# Patient Record
Sex: Female | Born: 1960
Health system: Southern US, Community
[De-identification: ages and names within clinical notes are randomized; demographics above are authoritative.]

## PROBLEM LIST (undated history)

## (undated) DIAGNOSIS — G894 Chronic pain syndrome: Secondary | ICD-10-CM

## (undated) DIAGNOSIS — F32A Depression, unspecified: Secondary | ICD-10-CM

## (undated) DIAGNOSIS — F329 Major depressive disorder, single episode, unspecified: Secondary | ICD-10-CM

## (undated) DIAGNOSIS — I10 Essential (primary) hypertension: Secondary | ICD-10-CM

## (undated) DIAGNOSIS — R0902 Hypoxemia: Secondary | ICD-10-CM

## (undated) DIAGNOSIS — J449 Chronic obstructive pulmonary disease, unspecified: Secondary | ICD-10-CM

## (undated) DIAGNOSIS — F419 Anxiety disorder, unspecified: Secondary | ICD-10-CM

## (undated) DIAGNOSIS — G4730 Sleep apnea, unspecified: Secondary | ICD-10-CM

## (undated) HISTORY — DX: Depression, unspecified: F32.A

## (undated) HISTORY — DX: Sleep apnea, unspecified: G47.30

## (undated) HISTORY — DX: Hypoxemia: R09.02

## (undated) HISTORY — DX: Anxiety disorder, unspecified: F41.9

## (undated) HISTORY — DX: Major depressive disorder, single episode, unspecified: F32.9

## (undated) HISTORY — PX: CHOLECYSTECTOMY: SHX55

## (undated) HISTORY — DX: Essential (primary) hypertension: I10

## (undated) HISTORY — DX: Chronic pain syndrome: G89.4

---

## 2001-04-11 ENCOUNTER — Inpatient Hospital Stay (HOSPITAL_COMMUNITY): Admission: AD | Admit: 2001-04-11 | Discharge: 2001-04-12 | Payer: Self-pay | Admitting: Family Medicine

## 2001-04-12 ENCOUNTER — Inpatient Hospital Stay (HOSPITAL_COMMUNITY): Admission: EM | Admit: 2001-04-12 | Discharge: 2001-04-17 | Payer: Self-pay | Admitting: *Deleted

## 2001-11-17 ENCOUNTER — Encounter: Payer: Self-pay | Admitting: Family Medicine

## 2001-11-17 ENCOUNTER — Ambulatory Visit (HOSPITAL_COMMUNITY): Admission: RE | Admit: 2001-11-17 | Discharge: 2001-11-17 | Payer: Self-pay | Admitting: Family Medicine

## 2002-01-01 ENCOUNTER — Ambulatory Visit (HOSPITAL_COMMUNITY): Admission: RE | Admit: 2002-01-01 | Discharge: 2002-01-01 | Payer: Self-pay | Admitting: Internal Medicine

## 2002-01-01 ENCOUNTER — Encounter: Payer: Self-pay | Admitting: Internal Medicine

## 2002-02-27 ENCOUNTER — Encounter: Payer: Self-pay | Admitting: Internal Medicine

## 2002-02-27 ENCOUNTER — Ambulatory Visit (HOSPITAL_COMMUNITY): Admission: RE | Admit: 2002-02-27 | Discharge: 2002-02-27 | Payer: Self-pay | Admitting: Internal Medicine

## 2002-04-23 ENCOUNTER — Encounter: Payer: Self-pay | Admitting: Neurosurgery

## 2002-04-23 ENCOUNTER — Encounter: Admission: RE | Admit: 2002-04-23 | Discharge: 2002-04-23 | Payer: Self-pay | Admitting: Neurosurgery

## 2002-05-07 ENCOUNTER — Encounter: Payer: Self-pay | Admitting: Neurosurgery

## 2002-05-07 ENCOUNTER — Encounter: Admission: RE | Admit: 2002-05-07 | Discharge: 2002-05-07 | Payer: Self-pay | Admitting: Neurosurgery

## 2002-05-21 ENCOUNTER — Encounter: Payer: Self-pay | Admitting: Neurosurgery

## 2002-05-21 ENCOUNTER — Encounter: Admission: RE | Admit: 2002-05-21 | Discharge: 2002-05-21 | Payer: Self-pay | Admitting: Neurosurgery

## 2003-05-13 ENCOUNTER — Ambulatory Visit (HOSPITAL_COMMUNITY): Admission: RE | Admit: 2003-05-13 | Discharge: 2003-05-13 | Payer: Self-pay | Admitting: Family Medicine

## 2003-05-20 ENCOUNTER — Ambulatory Visit (HOSPITAL_COMMUNITY): Admission: RE | Admit: 2003-05-20 | Discharge: 2003-05-20 | Payer: Self-pay | Admitting: Family Medicine

## 2003-05-30 ENCOUNTER — Ambulatory Visit (HOSPITAL_COMMUNITY): Admission: RE | Admit: 2003-05-30 | Discharge: 2003-05-30 | Payer: Self-pay | Admitting: General Surgery

## 2003-11-22 ENCOUNTER — Ambulatory Visit: Payer: Self-pay | Admitting: Psychology

## 2003-11-25 ENCOUNTER — Ambulatory Visit (HOSPITAL_COMMUNITY): Admission: RE | Admit: 2003-11-25 | Discharge: 2003-11-25 | Payer: Self-pay | Admitting: Family Medicine

## 2004-06-03 ENCOUNTER — Ambulatory Visit (HOSPITAL_COMMUNITY): Admission: RE | Admit: 2004-06-03 | Discharge: 2004-06-03 | Payer: Self-pay | Admitting: Family Medicine

## 2004-10-26 ENCOUNTER — Inpatient Hospital Stay (HOSPITAL_COMMUNITY): Admission: AD | Admit: 2004-10-26 | Discharge: 2004-10-31 | Payer: Self-pay | Admitting: Family Medicine

## 2009-09-02 ENCOUNTER — Ambulatory Visit (HOSPITAL_COMMUNITY): Admission: RE | Admit: 2009-09-02 | Discharge: 2009-09-02 | Payer: Self-pay | Admitting: Internal Medicine

## 2009-12-16 ENCOUNTER — Ambulatory Visit (HOSPITAL_COMMUNITY): Admission: RE | Admit: 2009-12-16 | Discharge: 2009-12-16 | Payer: Self-pay | Admitting: Internal Medicine

## 2010-01-14 ENCOUNTER — Ambulatory Visit (HOSPITAL_COMMUNITY): Admission: RE | Admit: 2010-01-14 | Discharge: 2010-01-14 | Payer: Self-pay | Admitting: Internal Medicine

## 2010-03-28 ENCOUNTER — Encounter: Payer: Self-pay | Admitting: Internal Medicine

## 2010-07-24 NOTE — Discharge Summary (Signed)
[facility]  Patient:    Heidi Mills, Heidi Mills Visit Number: [id number] MRN: [id number]          Service Type: PSY Location: 500 [id number] Attending Physician:  Denny Peon Dictated by:   Karleen Hampshire, M.D. Admit Date:  04/12/2001 Discharge Date: 04/17/2001                             Discharge Summary  DISCHARGE DIAGNOSES: 1. Overdose. 2. History of depression. 3. Hypothyroidism. 4. History of hypertension.  HOSPITAL COURSE:  This 50 year old white female was recently seen at [facility] Emergency Room, unconscious after being found in the bathroom approximately 5:15 a.m.  CT scan of the brain was negative.  Drug screen was positive for benzodiazepines and phencyclidine.  The patient was difficult to arouse but otherwise with normal vital signs.  She was transferred to the ICU here at this hospital, where she was observed closely with neurologic checks.  The patient, after awakening approximately 12-24 hours later, was seen by the mental health team and found to be depressed with suicidal thoughts.  She was discharged and transferred to [facility].  The patient was stable at the time of discharge.  She was alert and oriented.  During her stay she had some mildly elevated blood pressures, but these were treated adequately. Dictated by:   Karleen Hampshire, M.D. Attending Physician:  Denny Peon DD:  05/09/01 TD:  05/09/01 Job: [id number]

## 2010-07-24 NOTE — Discharge Summary (Signed)
Heidi Mills, Heidi Mills                ACCOUNT NO.:  [id number]   MEDICAL RECORD NO.:  [id number]          PATIENT TYPE:  INP   LOCATION:  A322                          FACILITY:  [facility]   PHYSICIAN:  Kirk Ruths, M.D.DATE OF BIRTH:  [birth year]   DATE OF ADMISSION:  10/26/2004  DATE OF DISCHARGE:  08/26/2006LH                                 DISCHARGE SUMMARY   FINAL DIAGNOSES:  1.  Urosepsis secondary to Klebsiella.  2.  History of hypertension.  3.  History of hypertension.  4.  Kidney stones.   HOSPITAL COURSE:  This 50 year old female was admitted to the office after  one month history of progressive urinary discomfort.  In the office, the  patient had a fever of 102 with left flank pain, some mild tenderness of the  left lower quadrant.  Urinalysis was positive for nitrates and many white  cells.  The patient was admitted to the floor, placed on IV Levaquin.  Initial white count was 17,000.  She underwent a CT of the abdomen and  pelvis to rule out acute abdominal problem.  Basically, CT's were consistent  with lymph pyelonephritis.  The patient slowly started feeling better but  continued to run fevers in the 102 range.  The patient was changed to  Rocephin and a short time later, cultures returned Klebsiella pneumonia in  her blood, which is sensitive to basically all medications except for  ampicillin.  The patient became afebrile at that time, tolerating a regular  diet.  She was comfortable at discharge and sent home on previous  medications which include Xanax, Topamax, Prempro.  She was also given a  prescription for Levaquin and will be followed in the office as needed.      Kirk Ruths, M.D.  Electronically Signed     WMM/MEDQ  D:  11/16/2004  T:  11/16/2004  Job:  [id number]

## 2010-07-24 NOTE — H&P (Signed)
Heidi Mills, Heidi Mills                          ACCOUNT NO.:  [id number]   MEDICAL RECORD NO.:  [id number]                   PATIENT TYPE:  OUT   LOCATION:  RAD                                  FACILITY:  [facility]   PHYSICIAN:  Dalia Heading, M.D.               DATE OF BIRTH:  Sep 28, 1960   DATE OF ADMISSION:  05/13/2003  DATE OF DISCHARGE:  05/13/2003                                HISTORY & PHYSICAL   CHIEF COMPLAINT:  Chronic cholecystitis.   HISTORY OF PRESENT ILLNESS:  The patient is a 50 year old white female who  was referred for evaluation and treatment of biliary colic secondary to  chronic cholecystitis.  She has been having intermittent episodes of right  upper quadrant abdominal pain with radiation to the right flank, nausea and  bloating for four weeks.  The symptoms seemed to be worsening.  No fever,  chills or jaundice have been noted.   PAST MEDICAL HISTORY:  1. Anxiety disorder.  2. Chronic back pain.   PAST SURGICAL HISTORY:  Unremarkable.   CURRENT MEDICATIONS:  1. Celexa.  2. Remeron.  3. Xanax.  4. Topamax.  5. A pill for arthritis.   ALLERGIES:  SULFA.   REVIEW OF SYSTEMS:  The patient states she has a history of hepatitis,  though it did not appear to be infectious in nature.  She does smoke 1-1/2  packs of cigarettes a day.  She rarely drinks alcohol.  She denies any other  cardiopulmonary difficulties or bleeding disorders.   PHYSICAL EXAMINATION:  GENERAL:  The patient is a well-developed, well-  nourished white female, in no acute distress.  VITAL SIGNS:  She is afebrile.  Vital signs are stable.  HEENT:  Examination reveals no scleral icterus.  LUNGS:  Clear to auscultation with equal breath sounds bilaterally.  HEART:  Examination reveals a regular rate and rhythm without S3, S4, or  murmurs.  ABDOMEN:  Soft, with slight tenderness in the right upper quadrant to  palpation.  No hepatosplenomegaly, masses or hernias are identified.   STUDIES:  1. The ultrasound of the gallbladder is normal.  2. Hepatobiliary scan revealed delayed filling of the gallbladder with     reproducible symptoms with a fatty meal.   IMPRESSION:  Chronic cholecystitis.   PLAN:  The patient was scheduled for laparoscopic cholecystectomy on May 30, 2003.  The risks and benefits of the procedure, including bleeding,  infection, hepatobiliary injury, and the possibility of an open procedure  were full explained to the patient, who gave informed consent.  Promethazine  and Darvocet have been prescribed.     ___________________________________________                                         Dalia Heading, M.D.   MAJ/MEDQ  D:  05/28/2003  T:  05/28/2003  Job:  [id number]   cc:   Kirk Ruths, M.D.  [address]  [place]  [place] [address]  Fax: [phone]

## 2010-07-24 NOTE — Op Note (Signed)
NAME:  Heidi Mills, Heidi Mills                          ACCOUNT NO.:  [id number]   MEDICAL RECORD NO.:  [id number]                   PATIENT TYPE:  AMB   LOCATION:  DAY                                  FACILITY:  [facility]   PHYSICIAN:  Dalia Heading, M.D.               DATE OF BIRTH:  01/30/1961   DATE OF PROCEDURE:  DATE OF DISCHARGE:                                 OPERATIVE REPORT   PREOPERATIVE DIAGNOSIS:  Chronic cholecystitis.   POSTOPERATIVE DIAGNOSIS:  Chronic cholecystitis.   PROCEDURE:  Laparoscopic cholecystectomy.   SURGEON:  Dalia Heading, M.D.   ASSISTANT:  Buena Irish, M.D.   ANESTHESIA:  General endotracheal.   INDICATIONS:  The patient is a 50 year old white female who presents with  biliary colic secondary to chronic cholecystitis.  The risks and benefits of  the procedure including bleeding, infection, hepatobiliary injury, and the  possibility of an open procedure were fully explained to the patient, who  gave informed consent.   PROCEDURE NOTE:  The patient was placed in the supine position.  After  induction of general endotracheal anesthesia, the abdomen was prepped and  draped using the usual sterile technique with Betadine.  Surgical site  confirmation was performed.   An infraumbilical incision was made down to the fascia.  A Veress needle was  introduced into the abdominal cavity and confirmation of placement was done  using the saline drop test.  The abdomen was then insufflated to 16 mmHg  pressure.  An 11-mm trocar was introduced into the abdominal cavity under  direct visualization without difficulty.  An additional 11-mm trocar was  placed in the epigastric region and 5-mm trocars were placed in the right  upper quadrant and right flank regions.   The liver was inspected and several adhesions were noted to the underside of  the peritoneal surface.  These were lysed without difficulty.  The  gallbladder was retracted superiorly and laterally.   The dissection was  begun around the infundibulum of the gallbladder.  The cystic duct was first  identified.  Its juncture to the infundibulum fully identified.  Endoclips  were placed proximally and distally on the cystic duct; and the cystic duct  was divided.  This was likewise done on the cystic artery.  The gallbladder  was then freed away from the gallbladder fossa using Bovie electrocautery.  The gallbladder was delivered through the epigastric trocar site using an  EndoCatch bag.  The gallbladder fossa was inspected and no abnormal bleeding  or bile leakage was noted.  Surgicel was placed in the gallbladder fossa.  All fluid and air were then evacuated from the abdominal cavity prior to  removal of the trocars.   All wounds were irrigated with normal saline.  All wounds were injected with  0.5% Sensorcaine.  The infraumbilical fascia as well as the epigastric  fascia were reapproximated using an #0 Vicryl interrupted  suture.  All skin  incisions were closed using staples.  Betadine ointment and dry sterile  dressings were applied.   All tape and needle counts correct at the end of the procedure.  The patient  was extubated in the operating room and went back to recovery room in awake  and stable condition.   COMPLICATIONS:  None.   SPECIMEN:  Gallbladder.   BLOOD LOSS:  Minimal.      ___________________________________________                                            Dalia Heading, M.D.   MAJ/MEDQ  D:  05/30/2003  T:  05/30/2003  Job:  [id number]   cc:   Dalia Heading, M.D.  [address], [place]  [place]  [place] [address]  Fax: [phone]   Kirk Ruths, M.D.  [address]  [place]  [place] [address]  Fax: [phone]
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
## 2010-07-24 NOTE — H&P (Signed)
Heidi Mills, Heidi Mills                ACCOUNT NO.:  [id number]   MEDICAL RECORD NO.:  [id number]           PATIENT TYPE:  INP   LOCATION:  A322                          FACILITY:  [facility]   PHYSICIAN:  Kirk Ruths, M.D.DATE OF BIRTH:  [birth year]   DATE OF ADMISSION:  DATE OF DISCHARGE:  LH                                HISTORY & PHYSICAL   CHIEF COMPLAINT:  Fever and low back pain.   HISTORY OF PRESENT ILLNESS:  This is a 50 year old female who began with  some urinary discomfort approximately a month ago.  She took over-the-  counter medication with some relief.  She was continuing to have some  urethral symptoms.  Then today she developed a fever, pain in her left flank  and low back, as well as left lower quadrant.  The patient's urinalysis  shows positive nitrates and many white cells in her urine, as well as blood.  She is admitted for presumptive pyelonephritis.   PAST MEDICAL HISTORY:  She has a history of:  1.  Hypertension.  2.  Goiter.  3.  Depression.  4.  Kidney stones.   ALLERGIES:  SULFA.   MEDICATIONS:  Include:  1.  Xanax 1 mg p.r.n.  2.  Topamax 75 mg q.8 a.m. and 50 mg q.h.s.  3.  She is also on Prempro.   REVIEW OF SYSTEMS:  She denies nausea and vomiting, shortness of breath, or  chest pain.   PHYSICAL EXAMINATION:  GENERAL:  A middle-aged female who appears miserable.  VITAL SIGNS:  Temperature is 102, pulse is 76 and regular, blood pressure is  120/70.  HEENT:  TMs are normal.  Pupils are equal to accommodation.  Oropharynx is  benign.  NECK:  Supple without JVD, bruits, or thyromegaly.  LUNGS:  Clear in all areas.  HEART:  Regular sinus rhythm without murmurs, rubs, or gallops.  ABDOMEN:  Soft.  Left lower quadrant tenderness with diminished bowel  sounds.  The left flank is slightly tender also.  EXTREMITIES:  Without cyanosis, clubbing, or edema.  NEUROLOGIC:  Grossly intact.   ASSESSMENT:  1.  Probable pyelonephritis.  2.  History of  hypertension.      Kirk Ruths, M.D.  Electronically Signed     WMM/MEDQ  D:  10/26/2004  T:  10/26/2004  Job:  [id number]

## 2010-07-24 NOTE — Discharge Summary (Signed)
[facility]  Patient:    BULA, CAVALIERI Visit Number: [id number] MRN: [id number]          Service Type: PSY Location: [id number] Attending Physician:  Denny Peon Dictated by:   Reymundo Poll Dub Mikes, M.D. Admit Date:  04/12/2001 Discharge Date: 04/17/2001                             Discharge Summary  CHIEF COMPLAINT AND HISTORY OF PRESENT ILLNESS:  This was the first admission to [facility] for this 50 year old female found unresponsive on the bathroom floor, last known to be alert 2:30 in the morning, found at 5:15 a.m.  EMS was called by her husband who was at home with her at the time.  Admitted to ICU until stable.  Reported she took a handful of Relafen and possibly some Seroquel but was unclear what she took, had poor memory of what happened.  Reported use of alcohol prior to this event but was vague about whether she had alcohol the day of the event or one to two days prior; poor memory for the event.  She wanted to go to sleep, not wake up.  The next thing she knew she woke up in the hospital.  Prior to this event she had four drinks, enough to get her good and drunk, she reported.  The patient denied frequent alcohol abuse.  Had a history of alcohol abuse in the distant past as well as prior benzodiazepine abuse.  Increased depression with anxiety, frequent crying spells since July 2002, worse in the last few weeks. Endorsed poor sleep, frequent awakening over the past two weeks.  Lost 30 pounds in the last three months.  Difficulty concentrating, described herself as an anxious person but denied any panic attacks, denied any suicidal ideas.  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  First admission to [facility].  PAST MEDICAL HISTORY:  Positive for hypertension, hypothyroidism, chronic back pain, hepatitis.  MEDICATIONS ON ADMISSION: 1.  Diovan and hydrochlorothiazide 80/12.5 mg. 2. Xanax 1 mg one to two as needed. 3. Naproxen for pain.  PHYSICAL EXAMINATION:  GENERAL:  Performed and failed to show any acute findings.  MENTAL STATUS EXAMINATION ON ADMISSION:  Thin, gaunt appearing female with bronze tone complexion.  Bright, friendly affect.  Slight tremor, mild agitation; otherwise polite and cooperative.  Speech was normal without pressure.  Mood was anxious.  Thought processes: Logical and goal directed; no dangerous ideas, no evidence of suicidal ideas, no homicidal ideas, no psychosis, no delusions.  Cognitive: Well preserved.  ADMITTING DIAGNOSES: Axis I:    1. Alcohol dependence.            2. Depressive disorder, not otherwise specified.            3. Benzodiazepine abuse. Axis II:   No diagnosis. Axis III:  1. Status post ______ and Seroquel overdose.            2. Hypothyroidism.            3. Hepatitis. Axis IV:   Moderate. Axis V:    Global assessment of functioning upon admission 34, highest global            assessment of functioning in the last year 64.  LABORATORY DATA:  Serum chemistry was within normal limits.  Thyroid profile was within normal limits.  Anti-HCV was positive.  Urine pregnancy was negative.  HOSPITAL COURSE:  She was admitted and started in intensive individual and group psychotherapy.  Admitted to feeling being depressed, wanting to give up, used increased amount of alcohol, as she says, to cope with the anxiety.  She did not feel that the Zoloft had worked.  She was detoxified using phenobarbital.  We started Celexa 10 mg per day and it was changed to 20 mg. She was on Topamax, Celexa was increased to 30 mg per day and she was given Seroquel and Remeron at bedtime.  By February 10, she was doing well, mood had improved, affect was bright, no suicidal ideas, no homicidal ideas.  Willing and motivated to pursue further treatment as well work on long-term sobriety.  DISCHARGE  DIAGNOSES: Axis I:    1. Alcohol dependence.            2. Depressive disorder, not otherwise specified. Axis II:   No diagnosis. Axis III:  1. Status post overdose.            2. Hypothyroidism.            3. Hepatitis. Axis IV:   Moderate. Axis V:    Global assessment of functioning upon discharge 55-60.  DISCHARGE MEDICATIONS: 1. Xanax 1 mg three times a day. 2. Topamax 25 mg two at bedtime. 3. Celexa 20 mg one and a half daily. 4. Remeron 15 mg half at bedtime.  FOLLOWUP:  Dr. Katrinka Blazing on an outpatient basis. Dictated by:   Reymundo Poll Dub Mikes, M.D. Attending Physician:  Denny Peon DD:  05/24/01 TD:  05/25/01 Job: [id number]

## 2011-07-05 ENCOUNTER — Ambulatory Visit (HOSPITAL_COMMUNITY)
Admission: RE | Admit: 2011-07-05 | Discharge: 2011-07-05 | Disposition: A | Discharge status: Home / Self Care | Payer: Self-pay | Source: Ambulatory Visit | Attending: Internal Medicine | Admitting: Internal Medicine

## 2011-07-05 ENCOUNTER — Other Ambulatory Visit (HOSPITAL_COMMUNITY): Payer: Self-pay | Admitting: Internal Medicine

## 2011-07-05 DIAGNOSIS — M549 Dorsalgia, unspecified: Secondary | ICD-10-CM

## 2011-07-05 DIAGNOSIS — M545 Low back pain, unspecified: Secondary | ICD-10-CM | POA: Insufficient documentation

## 2011-07-05 DIAGNOSIS — M533 Sacrococcygeal disorders, not elsewhere classified: Secondary | ICD-10-CM | POA: Insufficient documentation

## 2011-07-05 DIAGNOSIS — M51379 Other intervertebral disc degeneration, lumbosacral region without mention of lumbar back pain or lower extremity pain: Secondary | ICD-10-CM | POA: Insufficient documentation

## 2011-07-05 DIAGNOSIS — M5137 Other intervertebral disc degeneration, lumbosacral region: Secondary | ICD-10-CM | POA: Insufficient documentation

## 2011-07-09 ENCOUNTER — Other Ambulatory Visit (HOSPITAL_COMMUNITY): Payer: Self-pay | Admitting: Internal Medicine

## 2011-07-09 DIAGNOSIS — M549 Dorsalgia, unspecified: Secondary | ICD-10-CM

## 2011-07-13 ENCOUNTER — Ambulatory Visit (HOSPITAL_COMMUNITY)
Admission: RE | Admit: 2011-07-13 | Discharge: 2011-07-13 | Disposition: A | Discharge status: Home / Self Care | Payer: Self-pay | Source: Ambulatory Visit | Attending: Internal Medicine | Admitting: Internal Medicine

## 2011-07-13 DIAGNOSIS — M47817 Spondylosis without myelopathy or radiculopathy, lumbosacral region: Secondary | ICD-10-CM | POA: Insufficient documentation

## 2011-07-13 DIAGNOSIS — M5137 Other intervertebral disc degeneration, lumbosacral region: Secondary | ICD-10-CM | POA: Insufficient documentation

## 2011-07-13 DIAGNOSIS — M545 Low back pain, unspecified: Secondary | ICD-10-CM | POA: Insufficient documentation

## 2011-07-13 DIAGNOSIS — M549 Dorsalgia, unspecified: Secondary | ICD-10-CM

## 2011-07-13 DIAGNOSIS — M51379 Other intervertebral disc degeneration, lumbosacral region without mention of lumbar back pain or lower extremity pain: Secondary | ICD-10-CM | POA: Insufficient documentation

## 2011-07-13 DIAGNOSIS — M79609 Pain in unspecified limb: Secondary | ICD-10-CM | POA: Insufficient documentation

## 2011-12-06 ENCOUNTER — Other Ambulatory Visit (HOSPITAL_COMMUNITY): Payer: Self-pay | Admitting: Physician Assistant

## 2011-12-06 ENCOUNTER — Ambulatory Visit (HOSPITAL_COMMUNITY)
Admission: RE | Admit: 2011-12-06 | Discharge: 2011-12-06 | Disposition: A | Discharge status: Home / Self Care | Payer: Self-pay | Source: Ambulatory Visit | Attending: Physician Assistant | Admitting: Physician Assistant

## 2011-12-06 DIAGNOSIS — M25579 Pain in unspecified ankle and joints of unspecified foot: Secondary | ICD-10-CM

## 2011-12-06 DIAGNOSIS — S8263XA Displaced fracture of lateral malleolus of unspecified fibula, initial encounter for closed fracture: Secondary | ICD-10-CM | POA: Insufficient documentation

## 2011-12-06 DIAGNOSIS — S93409A Sprain of unspecified ligament of unspecified ankle, initial encounter: Secondary | ICD-10-CM

## 2011-12-06 DIAGNOSIS — X58XXXA Exposure to other specified factors, initial encounter: Secondary | ICD-10-CM | POA: Insufficient documentation

## 2011-12-07 ENCOUNTER — Emergency Department (HOSPITAL_COMMUNITY)
Admission: EM | Admit: 2011-12-07 | Discharge: 2011-12-08 | Disposition: A | Discharge status: Home / Self Care | Payer: Self-pay | Attending: Emergency Medicine | Admitting: Emergency Medicine

## 2011-12-07 ENCOUNTER — Encounter (HOSPITAL_COMMUNITY): Payer: Self-pay | Admitting: Emergency Medicine

## 2011-12-07 DIAGNOSIS — I959 Hypotension, unspecified: Secondary | ICD-10-CM | POA: Insufficient documentation

## 2011-12-07 DIAGNOSIS — G8929 Other chronic pain: Secondary | ICD-10-CM | POA: Insufficient documentation

## 2011-12-07 DIAGNOSIS — F17200 Nicotine dependence, unspecified, uncomplicated: Secondary | ICD-10-CM | POA: Insufficient documentation

## 2011-12-07 DIAGNOSIS — J449 Chronic obstructive pulmonary disease, unspecified: Secondary | ICD-10-CM | POA: Insufficient documentation

## 2011-12-07 DIAGNOSIS — T400X1A Poisoning by opium, accidental (unintentional), initial encounter: Secondary | ICD-10-CM | POA: Insufficient documentation

## 2011-12-07 DIAGNOSIS — M549 Dorsalgia, unspecified: Secondary | ICD-10-CM | POA: Insufficient documentation

## 2011-12-07 DIAGNOSIS — Z888 Allergy status to other drugs, medicaments and biological substances status: Secondary | ICD-10-CM | POA: Insufficient documentation

## 2011-12-07 DIAGNOSIS — J4489 Other specified chronic obstructive pulmonary disease: Secondary | ICD-10-CM | POA: Insufficient documentation

## 2011-12-07 DIAGNOSIS — T40601A Poisoning by unspecified narcotics, accidental (unintentional), initial encounter: Secondary | ICD-10-CM | POA: Insufficient documentation

## 2011-12-07 HISTORY — DX: Chronic obstructive pulmonary disease, unspecified: J44.9

## 2011-12-07 NOTE — ED Notes (Signed)
Pt reports to have accidentally taken too many oxycodone pills, states to have taken 2-3 15mg  tablets. Denies SI just states it was an accident. Also states she took an [place] and also takes lexapro. Pt was found unresponsive by EMS. Pt given 0.5mg  narcan in route and woke up, pt aax4 at this time, ems reports pt is hypotensive.

## 2011-12-08 LAB — BASIC METABOLIC PANEL
BUN: 10 mg/dL (ref 6–23)
Chloride: 102 mEq/L (ref 96–112)
Creatinine, Ser: 0.9 mg/dL (ref 0.50–1.10)
GFR calc Af Amer: 84 mL/min — ABNORMAL LOW (ref >90)

## 2011-12-08 LAB — CBC
HCT: 44.1 % (ref 36.0–46.0)
MCHC: 34 g/dL (ref 30.0–36.0)
MCV: 95.7 fL (ref 78.0–100.0)
RDW: 14.2 % (ref 11.5–15.5)
WBC: 8.7 10*3/uL (ref 4.0–10.5)

## 2011-12-08 MED ORDER — SODIUM CHLORIDE 0.9 % IV SOLN
1000.0000 mL | Freq: Once | INTRAVENOUS | Status: AC
  Administered 2011-12-08: 1000 mL via INTRAVENOUS

## 2011-12-08 MED ORDER — SODIUM CHLORIDE 0.9 % IV SOLN
1000.0000 mL | INTRAVENOUS | Status: DC
  Administered 2011-12-08: 1000 mL via INTRAVENOUS

## 2011-12-08 NOTE — ED Provider Notes (Signed)
History     CSN: [id number]  Arrival date & time 12/07/11  2340   First MD Initiated Contact with Patient 12/08/11 0002      Chief Complaint  Patient presents with  . Drug Overdose  . Hypotension     The history is provided by the patient, the spouse and a relative.   patient has long-standing history of chronic back pain.  Should her recent left ankle fracture for which she required some additional pain medicine.  She is on oxycodone 15 mg immediate release tablets at home.  She was found minimally responsive by her family in bed tonight was difficult to arouse.  EMS arrived the patient pinpoint pupils.  She was given Narcan with complete resolution of her symptoms.    She denies chest pain shortness breath.  She has no abdominal pain.  She has no other complaints.  She is unsure how much of her oxycodone she takes  She's now at the bedside without any complaints.  Family reports is back to baseline.  Her presenting blood pressure however was 83/54.    Past Medical History  Diagnosis Date  . COPD (chronic obstructive pulmonary disease)   . Back pain     History reviewed. No pertinent past surgical history.  History reviewed. No pertinent family history.  History  Substance Use Topics  . Smoking status: Current Every Day Smoker -- 1.0 packs/day    Types: Cigarettes  . Smokeless tobacco: Not on file  . Alcohol Use: No    OB History    Grav Para Term Preterm Abortions TAB SAB Ect Mult Living                  Review of Systems  All other systems reviewed and are negative.    Allergies  Sulfa antibiotics  Home Medications   Current Outpatient Rx  Name Route Sig Dispense Refill  . ESCITALOPRAM OXALATE 20 MG PO TABS Oral Take 20 mg by mouth daily.    . OXYCODONE HCL 15 MG PO TABS Oral Take 15 mg by mouth every 4 (four) hours as needed.    Marland Kitchen ZOLPIDEM TARTRATE 5 MG PO TABS Oral Take 5 mg by mouth at bedtime as needed.      BP 100/63  Pulse 72  Temp 97.5 F (36.4  C) (Oral)  Resp 15  Ht 5\' 2"  (1.575 m)  Wt 115 lb (52.164 kg)  BMI 21.03 kg/m2  SpO2 99%  Physical Exam  Nursing note and vitals reviewed. Constitutional: She is oriented to person, place, and time. She appears well-developed and well-nourished. No distress.  HENT:  Head: Normocephalic and atraumatic.  Eyes: EOM are normal.  Neck: Normal range of motion.  Cardiovascular: Normal rate, regular rhythm and normal heart sounds.   Pulmonary/Chest: Effort normal and breath sounds normal.  Abdominal: Soft. She exhibits no distension. There is no tenderness.  Musculoskeletal: She exhibits no edema.  Neurological: She is alert and oriented to person, place, and time.  Skin: Skin is warm and dry.  Psychiatric: She has a normal mood and affect. Judgment normal.    ED Course  Procedures (including critical care time)  Labs Reviewed  CBC - Abnormal; Notable for the following:    Platelets 146 (*)     All other components within normal limits  BASIC METABOLIC PANEL - Abnormal; Notable for the following:    Calcium 8.3 (*)     GFR calc non Af Amer 72 (*)  GFR calc Af Amer 84 (*)     All other components within normal limits      1. Narcotic overdose       MDM  Is likely unintentional overdose of her oxycodone.  She'll be monitored in the emergency department.  Her hypertension is likely secondary to her oxycodone use.  She we hydrated with IV fluids and I will continue to monitor this closely.    1:15 AM 100/63 after IVFs. Drinking coffee and sitting up at the bedside.   1:55 AM Nearly 2.5 hours without any symptoms. Dc home as unintentional overdose.   Lyanne Co, MD 12/08/11 [phone]

## 2015-01-24 ENCOUNTER — Ambulatory Visit (HOSPITAL_COMMUNITY)
Admission: RE | Admit: 2015-01-24 | Discharge: 2015-01-24 | Disposition: A | Discharge status: Home / Self Care | Payer: Self-pay | Source: Ambulatory Visit | Attending: Internal Medicine | Admitting: Internal Medicine

## 2015-01-24 ENCOUNTER — Other Ambulatory Visit (HOSPITAL_COMMUNITY): Payer: Self-pay | Admitting: Internal Medicine

## 2015-01-24 DIAGNOSIS — R079 Chest pain, unspecified: Secondary | ICD-10-CM | POA: Insufficient documentation

## 2015-01-24 DIAGNOSIS — R918 Other nonspecific abnormal finding of lung field: Secondary | ICD-10-CM | POA: Insufficient documentation

## 2015-01-24 DIAGNOSIS — F17200 Nicotine dependence, unspecified, uncomplicated: Secondary | ICD-10-CM | POA: Insufficient documentation

## 2015-04-07 DIAGNOSIS — Z720 Tobacco use: Secondary | ICD-10-CM | POA: Insufficient documentation

## 2015-04-07 DIAGNOSIS — I209 Angina pectoris, unspecified: Secondary | ICD-10-CM | POA: Insufficient documentation

## 2015-04-07 DIAGNOSIS — R55 Syncope and collapse: Secondary | ICD-10-CM | POA: Insufficient documentation

## 2015-04-07 DIAGNOSIS — J449 Chronic obstructive pulmonary disease, unspecified: Secondary | ICD-10-CM | POA: Insufficient documentation

## 2015-10-20 DIAGNOSIS — Z1389 Encounter for screening for other disorder: Secondary | ICD-10-CM | POA: Diagnosis not present

## 2015-10-20 DIAGNOSIS — R0789 Other chest pain: Secondary | ICD-10-CM | POA: Diagnosis not present

## 2015-10-20 DIAGNOSIS — Z681 Body mass index (BMI) 19 or less, adult: Secondary | ICD-10-CM | POA: Diagnosis not present

## 2015-10-20 DIAGNOSIS — Z0000 Encounter for general adult medical examination without abnormal findings: Secondary | ICD-10-CM | POA: Diagnosis not present

## 2015-10-20 DIAGNOSIS — J449 Chronic obstructive pulmonary disease, unspecified: Secondary | ICD-10-CM | POA: Diagnosis not present

## 2015-11-18 ENCOUNTER — Encounter: Payer: Self-pay | Admitting: *Deleted

## 2015-11-22 DIAGNOSIS — G4730 Sleep apnea, unspecified: Secondary | ICD-10-CM | POA: Diagnosis not present

## 2015-11-23 DIAGNOSIS — G4730 Sleep apnea, unspecified: Secondary | ICD-10-CM | POA: Diagnosis not present

## 2015-11-24 ENCOUNTER — Other Ambulatory Visit: Payer: Self-pay | Admitting: Women's Health

## 2015-12-22 ENCOUNTER — Encounter (HOSPITAL_COMMUNITY): Payer: Medicare Other

## 2015-12-22 ENCOUNTER — Ambulatory Visit (INDEPENDENT_AMBULATORY_CARE_PROVIDER_SITE_OTHER): Payer: Medicare Other | Admitting: Cardiology

## 2015-12-22 ENCOUNTER — Encounter: Payer: Self-pay | Admitting: Cardiology

## 2015-12-22 VITALS — BP 132/74 | HR 83 | Ht 62.0 in | Wt 109.0 lb

## 2015-12-22 DIAGNOSIS — R002 Palpitations: Secondary | ICD-10-CM | POA: Diagnosis not present

## 2015-12-22 DIAGNOSIS — I10 Essential (primary) hypertension: Secondary | ICD-10-CM | POA: Diagnosis not present

## 2015-12-22 DIAGNOSIS — Z720 Tobacco use: Secondary | ICD-10-CM

## 2015-12-22 DIAGNOSIS — Z87898 Personal history of other specified conditions: Secondary | ICD-10-CM | POA: Diagnosis not present

## 2015-12-22 DIAGNOSIS — E782 Mixed hyperlipidemia: Secondary | ICD-10-CM

## 2015-12-22 NOTE — Progress Notes (Signed)
.    Cardiology Office Note  Date: 12/22/2015   ID: Heidi Mills. Heidi Mills, DOB December 23, 1960, MRN [id number]  PCP: Glo Herring., MD  Consulting Cardiologist: Rozann Lesches, MD   Chief Complaint  Patient presents with  . History of chest pain    History of Present Illness: Heidi Mills. Eisenhardt is a 55 y.o. female referred for cardiology consultation by Dr. Gerarda Fraction. She is a former patient of Dr. Hamilton Capri with the [facility] cardiology practice. I reviewed the available records. She has a history of chest pain and was seen back in January of this year for further evaluation, workup had been limited by lack of insurance including use of regular medications. Records indicate that she had a negative exercise echocardiogram, although I am not able to pull up the actual report. She states that she has felt better since being compliant with her medications, health insurance now in place. She is currently on aspirin, Lipitor, Isordil, and lisinopril.  She does report occasional palpitations, describes a feeling of rapid heartbeat. She has had no dizziness or syncope associated with this.  Today we discussed risk factor modification, smoking cessation. There is a history of premature CAD in her mother. She has been back on Lipitor recently.  I reviewed her ECG today which shows sinus rhythm with poor R wave progression anteriorly, rule out old anterior infarct pattern.  She states that she has had a recent sleep study and was diagnosed with sleep apnea by Dr. Gerarda Fraction. No treatment yet in place.  Past Medical History:  Diagnosis Date  . Anxiety   . Chronic pain syndrome   . COPD (chronic obstructive pulmonary disease) ([place])   . Depression   . Essential hypertension     Past Surgical History:  Procedure Laterality Date  . CHOLECYSTECTOMY      Current Outpatient Prescriptions  Medication Sig Dispense Refill  . albuterol (PROAIR HFA) 108 (90 Base) MCG/ACT inhaler Inhale 2 puffs into the lungs every 4  (four) hours as needed for wheezing or shortness of breath.    . ALPRAZolam (XANAX) 1 MG tablet Take 1 mg by mouth 4 (four) times daily.    Marland Kitchen aspirin EC 81 MG tablet Take 81 mg by mouth daily.    Marland Kitchen atorvastatin (LIPITOR) 10 MG tablet Take 10 mg by mouth daily.    Marland Kitchen escitalopram (LEXAPRO) 20 MG tablet Take 20 mg by mouth daily.    . isosorbide dinitrate (ISORDIL) 10 MG tablet Take 10 mg by mouth 3 (three) times daily.  11  . lisinopril (PRINIVIL,ZESTRIL) 2.5 MG tablet Take 2.5 mg by mouth daily.    . OXYCODONE HCL PO Take 30 mg by mouth every 4 (four) hours as needed.    . Tiotropium Bromide-Olodaterol (STIOLTO RESPIMAT) 2.5-2.5 MCG/ACT AERS Inhale 2 Inhalers into the lungs daily.     No current facility-administered medications for this visit.    Allergies:  Penicillins and Sulfa antibiotics   Social History: The patient  reports that she has been smoking Cigarettes.  She started smoking about 42 years ago. She has been smoking about 1.00 pack per day. She has never used smokeless tobacco. She reports that she does not drink alcohol.   Family History: The patient's family history includes CAD in her mother; Heart attack in her mother.   ROS:  Please see the history of present illness. Otherwise, complete review of systems is positive for daytime sleepiness and fatigue.  All other systems are reviewed and negative.   Physical Exam:  VS:  BP 132/74   Pulse 83   Ht 5\' 2"  (1.575 m)   Wt 109 lb (49.4 kg)   SpO2 93%   BMI 19.94 kg/m , BMI Body mass index is 19.94 kg/m.  Wt Readings from Last 3 Encounters:  12/22/15 109 lb (49.4 kg)  12/07/11 115 lb (52.2 kg)    General: Thin, chronically ill-appearing woman in no distress. HEENT: Conjunctiva and lids normal, oropharynx clear. Neck: Supple, no elevated JVP or carotid bruits, no thyromegaly. Lungs: Decreased breath sounds without wheezing, nonlabored breathing at rest. Cardiac: Regular rate and rhythm, no S3 or significant systolic  murmur, no pericardial rub. Abdomen: Soft, nontender, bowel sounds present, no guarding or rebound. Extremities: No pitting edema, distal pulses 2+. Skin: Warm and dry. Musculoskeletal: No kyphosis. Neuropsychiatric: Alert and oriented x3, affect grossly appropriate.  ECG: There is no old tracing for comparison.  Other Studies Reviewed Today:  Chest x-ray 01/24/2015: IMPRESSION: No acute cardiopulmonary disease.  Innumerable small calcified pulmonary nodules, likely related to old granulomatous disease. Remote varicella infection could have a similar appearance.  Assessment and Plan:  1. History of chest pain with cardiac risk factors including family history of premature CAD in her mother, active tobacco abuse, hyperlipidemia, and hypertension. Patient underwent previous cardiac testing with Dr. Hamilton Capri and had reportedly a negative exercise echocardiogram earlier this year. She is symptomatically stable now having been compliant with medical therapy, and we will plan to continue with observation for now. Requesting the report of her prior stress test.  2. Long-standing tobacco abuse. We did discuss smoking cessation today.  3. Hyperlipidemia, reports being back on Lipitor recently. Continues to follow with Dr. Gerarda Fraction.  4. Essential hypertension, no changes made to present regimen. She is on lisinopril.  5. Recently diagnosed obstructive sleep apnea by report.  6. Intermittent palpitations. We will obtain a 48-hour Holter monitor to investigate potential paroxysmal arrhythmias.  Current medicines were reviewed with the patient today.   Orders Placed This Encounter  Procedures  . Holter monitor - 48 hour  . EKG 12-Lead    Disposition: Follow-up with me in 6 months, sooner if needed.  Signed, Satira Sark, MD, [facility] 12/22/2015 2:54 PM    [facility] at [facility] [address] [place], [place] [address] Phone: [phone]; Fax: [phone]

## 2015-12-22 NOTE — Patient Instructions (Signed)
Your physician wants you to follow-up in: 6 months Dr Ferne Reus will receive a reminder letter in the mail two months in advance. If you don't receive a letter, please call our office to schedule the follow-up appointment. .  Your physician has recommended that you wear a holter monitor 48 hr. Holter monitors are medical devices that record the heart's electrical activity. Doctors most often use these monitors to diagnose arrhythmias. Arrhythmias are problems with the speed or rhythm of the heartbeat. The monitor is a small, portable device. You can wear one while you do your normal daily activities. This is usually used to diagnose what is causing palpitations/syncope (passing out).      Your physician recommends that you continue on your current medications as directed. Please refer to the Current Medication list given to you today.     Thank you for choosing [facility] !

## 2015-12-23 ENCOUNTER — Telehealth: Payer: Self-pay

## 2015-12-23 ENCOUNTER — Ambulatory Visit (HOSPITAL_COMMUNITY)
Admission: RE | Admit: 2015-12-23 | Discharge: 2015-12-23 | Disposition: A | Discharge status: Home / Self Care | Payer: Medicare Other | Source: Ambulatory Visit | Attending: Cardiology | Admitting: Cardiology

## 2015-12-23 DIAGNOSIS — R002 Palpitations: Secondary | ICD-10-CM | POA: Diagnosis not present

## 2015-12-23 NOTE — Telephone Encounter (Signed)
LM with dr Dion Body office in [place] [phone] asking for results of stress echo done this past jan/Feb,await return call or faxed results

## 2016-01-21 DIAGNOSIS — Z6821 Body mass index (BMI) 21.0-21.9, adult: Secondary | ICD-10-CM | POA: Diagnosis not present

## 2016-01-21 DIAGNOSIS — Z1389 Encounter for screening for other disorder: Secondary | ICD-10-CM | POA: Diagnosis not present

## 2016-01-21 DIAGNOSIS — R252 Cramp and spasm: Secondary | ICD-10-CM | POA: Diagnosis not present

## 2016-01-21 DIAGNOSIS — J449 Chronic obstructive pulmonary disease, unspecified: Secondary | ICD-10-CM | POA: Diagnosis not present

## 2016-04-23 DIAGNOSIS — J449 Chronic obstructive pulmonary disease, unspecified: Secondary | ICD-10-CM | POA: Diagnosis not present

## 2016-04-23 DIAGNOSIS — Z1389 Encounter for screening for other disorder: Secondary | ICD-10-CM | POA: Diagnosis not present

## 2016-04-23 DIAGNOSIS — G894 Chronic pain syndrome: Secondary | ICD-10-CM | POA: Diagnosis not present

## 2016-04-23 DIAGNOSIS — Z6820 Body mass index (BMI) 20.0-20.9, adult: Secondary | ICD-10-CM | POA: Diagnosis not present

## 2016-07-08 DIAGNOSIS — J449 Chronic obstructive pulmonary disease, unspecified: Secondary | ICD-10-CM | POA: Diagnosis not present

## 2016-07-08 DIAGNOSIS — Z882 Allergy status to sulfonamides status: Secondary | ICD-10-CM | POA: Diagnosis not present

## 2016-07-08 DIAGNOSIS — H1131 Conjunctival hemorrhage, right eye: Secondary | ICD-10-CM | POA: Diagnosis not present

## 2016-07-08 DIAGNOSIS — Z880 Allergy status to penicillin: Secondary | ICD-10-CM | POA: Diagnosis not present

## 2016-07-08 DIAGNOSIS — I10 Essential (primary) hypertension: Secondary | ICD-10-CM | POA: Diagnosis not present

## 2016-07-23 DIAGNOSIS — I10 Essential (primary) hypertension: Secondary | ICD-10-CM | POA: Diagnosis not present

## 2016-07-23 DIAGNOSIS — G894 Chronic pain syndrome: Secondary | ICD-10-CM | POA: Diagnosis not present

## 2016-07-30 ENCOUNTER — Encounter: Payer: Self-pay | Admitting: *Deleted

## 2016-10-15 DIAGNOSIS — Z6820 Body mass index (BMI) 20.0-20.9, adult: Secondary | ICD-10-CM | POA: Diagnosis not present

## 2016-10-15 DIAGNOSIS — J029 Acute pharyngitis, unspecified: Secondary | ICD-10-CM | POA: Diagnosis not present

## 2016-10-15 DIAGNOSIS — K1120 Sialoadenitis, unspecified: Secondary | ICD-10-CM | POA: Diagnosis not present

## 2016-10-15 DIAGNOSIS — M47816 Spondylosis without myelopathy or radiculopathy, lumbar region: Secondary | ICD-10-CM | POA: Diagnosis not present

## 2016-10-20 DIAGNOSIS — Z1211 Encounter for screening for malignant neoplasm of colon: Secondary | ICD-10-CM | POA: Diagnosis not present

## 2016-12-08 DIAGNOSIS — Z7982 Long term (current) use of aspirin: Secondary | ICD-10-CM | POA: Diagnosis not present

## 2016-12-08 DIAGNOSIS — T50904A Poisoning by unspecified drugs, medicaments and biological substances, undetermined, initial encounter: Secondary | ICD-10-CM | POA: Diagnosis not present

## 2016-12-08 DIAGNOSIS — J439 Emphysema, unspecified: Secondary | ICD-10-CM | POA: Diagnosis not present

## 2016-12-08 DIAGNOSIS — Z720 Tobacco use: Secondary | ICD-10-CM | POA: Diagnosis not present

## 2016-12-08 DIAGNOSIS — R061 Stridor: Secondary | ICD-10-CM | POA: Diagnosis not present

## 2016-12-08 DIAGNOSIS — T402X1A Poisoning by other opioids, accidental (unintentional), initial encounter: Secondary | ICD-10-CM | POA: Diagnosis not present

## 2016-12-08 DIAGNOSIS — Z79899 Other long term (current) drug therapy: Secondary | ICD-10-CM | POA: Diagnosis not present

## 2016-12-08 DIAGNOSIS — T50901A Poisoning by unspecified drugs, medicaments and biological substances, accidental (unintentional), initial encounter: Secondary | ICD-10-CM | POA: Diagnosis not present

## 2016-12-08 DIAGNOSIS — J449 Chronic obstructive pulmonary disease, unspecified: Secondary | ICD-10-CM | POA: Diagnosis not present

## 2016-12-13 DIAGNOSIS — J449 Chronic obstructive pulmonary disease, unspecified: Secondary | ICD-10-CM | POA: Diagnosis not present

## 2016-12-13 DIAGNOSIS — Z23 Encounter for immunization: Secondary | ICD-10-CM | POA: Diagnosis not present

## 2016-12-13 DIAGNOSIS — Z0001 Encounter for general adult medical examination with abnormal findings: Secondary | ICD-10-CM | POA: Diagnosis not present

## 2016-12-13 DIAGNOSIS — Z1389 Encounter for screening for other disorder: Secondary | ICD-10-CM | POA: Diagnosis not present

## 2016-12-13 DIAGNOSIS — G894 Chronic pain syndrome: Secondary | ICD-10-CM | POA: Diagnosis not present

## 2016-12-22 DIAGNOSIS — G4733 Obstructive sleep apnea (adult) (pediatric): Secondary | ICD-10-CM | POA: Diagnosis not present

## 2017-01-22 DIAGNOSIS — G4733 Obstructive sleep apnea (adult) (pediatric): Secondary | ICD-10-CM | POA: Diagnosis not present

## 2017-02-09 ENCOUNTER — Encounter: Payer: Self-pay | Admitting: Cardiology

## 2017-02-09 ENCOUNTER — Ambulatory Visit: Payer: Medicare Other | Admitting: Cardiology

## 2017-02-09 VITALS — BP 96/69 | HR 103 | Ht 62.0 in | Wt 111.0 lb

## 2017-02-09 DIAGNOSIS — E782 Mixed hyperlipidemia: Secondary | ICD-10-CM

## 2017-02-09 DIAGNOSIS — Z87898 Personal history of other specified conditions: Secondary | ICD-10-CM | POA: Diagnosis not present

## 2017-02-09 DIAGNOSIS — Z720 Tobacco use: Secondary | ICD-10-CM

## 2017-02-09 MED ORDER — AMLODIPINE BESYLATE 10 MG PO TABS: 10.0000 mg | ORAL_TABLET | Freq: Every day | ORAL | 3 refills | Status: DC

## 2017-02-09 MED ORDER — ISOSORBIDE DINITRATE 10 MG PO TABS: 10.0000 mg | ORAL_TABLET | Freq: Three times a day (TID) | ORAL | 3 refills | Status: DC

## 2017-02-09 NOTE — Patient Instructions (Signed)

## 2017-02-09 NOTE — Progress Notes (Signed)
Cardiology Office Note  Date: 02/09/2017   ID: Heidi Mills. Parlett, DOB 11-Feb-1961, MRN [id number]  PCP: Patient, No Pcp Per  Primary Cardiologist: Rozann Lesches, MD   Chief Complaint  Patient presents with  . Cardiac follow-up    History of Present Illness: [name]. Heidi Mills is a 56 y.o. female seen in consultation back in October 2017.  She presents to the office for follow-up.  In the interim she has lost her primary care provider.  From a cardiac perspective she denies any clear-cut angina symptoms.  I did receive her previous exercise echocardiogram report from [facility] which was reassuring as outlined below.  I personally reviewed her ECG today which shows normal sinus rhythm.  I reviewed her current medications.  She states that she needed refills for Isordil and Norvasc which we will provide.  Past Medical History:  Diagnosis Date  . Anxiety   . Chronic pain syndrome   . COPD (chronic obstructive pulmonary disease) ([place])   . Depression   . Essential hypertension     Past Surgical History:  Procedure Laterality Date  . CHOLECYSTECTOMY      Current Outpatient Medications  Medication Sig Dispense Refill  . albuterol (PROAIR HFA) 108 (90 Base) MCG/ACT inhaler Inhale 2 puffs into the lungs every 4 (four) hours as needed for wheezing or shortness of breath.    . ALPRAZolam (XANAX) 1 MG tablet Take 1 mg by mouth 4 (four) times daily.    Marland Kitchen amLODipine (NORVASC) 10 MG tablet Take 1 tablet (10 mg total) by mouth daily. 90 tablet 3  . aspirin EC 81 MG tablet Take 81 mg by mouth daily.    Marland Kitchen atorvastatin (LIPITOR) 10 MG tablet Take 10 mg by mouth daily.    Marland Kitchen escitalopram (LEXAPRO) 20 MG tablet Take 20 mg by mouth daily.    . isosorbide dinitrate (ISORDIL) 10 MG tablet Take 1 tablet (10 mg total) by mouth 3 (three) times daily. 90 tablet 3  . OXYCODONE HCL PO Take 30 mg by mouth every 4 (four) hours as needed.    . Tiotropium Bromide-Olodaterol (STIOLTO RESPIMAT) 2.5-2.5 MCG/ACT  AERS Inhale 2 Inhalers into the lungs daily.     No current facility-administered medications for this visit.    Allergies:  Penicillins and Sulfa antibiotics   Social History: The patient  reports that she has been smoking cigarettes.  She started smoking about 43 years ago. She has been smoking about 1.00 pack per day. she has never used smokeless tobacco. She reports that she does not drink alcohol.   ROS:  Please see the history of present illness. Otherwise, complete review of systems is positive for chronic back pain and leg pain.  All other systems are reviewed and negative.   Physical Exam: VS:  BP 96/69   Pulse (!) 103   Ht 5\' 2"  (1.575 m)   Wt 111 lb (50.3 kg)   SpO2 93% Comment: on room air  BMI 20.30 kg/m , BMI Body mass index is 20.3 kg/m.  Wt Readings from Last 3 Encounters:  02/09/17 111 lb (50.3 kg)  12/22/15 109 lb (49.4 kg)  12/07/11 115 lb (52.2 kg)    General: Thin woman, no distress. HEENT: Conjunctiva and lids normal, oropharynx clear. Neck: Supple, no elevated JVP or carotid bruits, no thyromegaly. Lungs: Diminished breath sounds without wheezing, nonlabored breathing at rest. Cardiac: Regular rate and rhythm, no S3 or significant systolic murmur, no pericardial rub. Abdomen: Soft, nontender, bowel sounds present,  no guarding or rebound. Extremities: No pitting edema, distal pulses 2+. Skin: Warm and dry. Musculoskeletal: No kyphosis. Neuropsychiatric: Alert and oriented x3, affect grossly appropriate.  ECG: I personally reviewed the tracing from 12/22/2015 which showed sinus rhythm with poor anterior R wave progression.  Other Studies Reviewed Today:  Exercise echocardiogram 04/17/2015 [facility]): No diagnostic ST segment changes at 82% MPHR.  No inducible wall motion abnormalities to indicate ischemia.  Assessment and Plan:  1.  History of chest pain with overall reassuring cardiac workup so far.  Exercise echocardiogram from last year was low risk.   ECG is normal today.  We will continue with risk factor modification and medical therapy.  Refills provided for Norvasc and Isordil.  2.  Health maintenance.  I recommended that she establish with a new primary care provider.  3.  Hyperlipidemia, states that she is taking Lipitor.  4.  Ongoing tobacco abuse.  We have discussed smoking cessation.  Current medicines were reviewed with the patient today.   Orders Placed This Encounter  Procedures  . EKG 12-Lead    Disposition: Follow-up in 1 year.  Signed, Satira Sark, MD, [facility] 02/09/2017 2:36 PM    [facility] at [facility] [address] [place], [place] [address] Phone: [phone]; Fax: [phone]

## 2017-02-21 DIAGNOSIS — G4733 Obstructive sleep apnea (adult) (pediatric): Secondary | ICD-10-CM | POA: Diagnosis not present
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
## 2017-03-23 ENCOUNTER — Ambulatory Visit (INDEPENDENT_AMBULATORY_CARE_PROVIDER_SITE_OTHER): Payer: Medicare Other | Admitting: Psychiatry

## 2017-03-23 ENCOUNTER — Encounter (HOSPITAL_COMMUNITY): Payer: Self-pay | Admitting: Psychiatry

## 2017-03-23 ENCOUNTER — Encounter: Payer: Self-pay | Admitting: Psychiatry

## 2017-03-23 ENCOUNTER — Encounter (INDEPENDENT_AMBULATORY_CARE_PROVIDER_SITE_OTHER): Payer: Self-pay

## 2017-03-23 VITALS — BP 130/76 | HR 93 | Ht 62.0 in | Wt 111.0 lb

## 2017-03-23 DIAGNOSIS — R45851 Suicidal ideations: Secondary | ICD-10-CM

## 2017-03-23 DIAGNOSIS — Z811 Family history of alcohol abuse and dependence: Secondary | ICD-10-CM

## 2017-03-23 DIAGNOSIS — G4700 Insomnia, unspecified: Secondary | ICD-10-CM | POA: Diagnosis not present

## 2017-03-23 DIAGNOSIS — F1021 Alcohol dependence, in remission: Secondary | ICD-10-CM | POA: Diagnosis not present

## 2017-03-23 DIAGNOSIS — Z818 Family history of other mental and behavioral disorders: Secondary | ICD-10-CM

## 2017-03-23 DIAGNOSIS — R450 Nervousness: Secondary | ICD-10-CM | POA: Diagnosis not present

## 2017-03-23 DIAGNOSIS — F331 Major depressive disorder, recurrent, moderate: Secondary | ICD-10-CM | POA: Diagnosis not present

## 2017-03-23 DIAGNOSIS — F419 Anxiety disorder, unspecified: Secondary | ICD-10-CM | POA: Diagnosis not present

## 2017-03-23 MED ORDER — ESCITALOPRAM OXALATE 20 MG PO TABS
20.0000 mg | ORAL_TABLET | Freq: Every day | ORAL | 0 refills | Status: DC
Start: 2017-03-23 — End: 2017-04-25

## 2017-03-23 MED ORDER — LORAZEPAM 1 MG PO TABS: 1.0000 mg | ORAL_TABLET | Freq: Three times a day (TID) | ORAL | 0 refills | Status: DC | PRN

## 2017-03-23 NOTE — Patient Instructions (Addendum)
1. Discontinue Xanax 2. Start ativan 1 mg three times a day as needed for anxiety  3. Continue lexapro 20 mg daily  4. Obtain urine drug screen 5. Obtain record from Dr. Gerarda Fraction 6. Referral to therapy 7. Return to clinic in one month for 30 mins  CONTACT INFORMATION  What to do if you need to get in touch with someone regarding a psychiatric issue:  1. EMERGENCY: For psychiatric emergencies (if you are suicidal or if there are any other safety issues) call 911 and/or go to your nearest Emergency Room immediately.   2. IF YOU NEED SOMEONE TO TALK TO RIGHT NOW: Given my clinical responsibilities, I may not be able to speak with you over the phone for a prolonged period of time.  a. You may always call The National Suicide Prevention Lifeline at 1-800-273-TALK (856)019-5130).  b. Your county of residence will also have local crisis services. For [facility]: [place] at [phone]

## 2017-03-24 DIAGNOSIS — G4733 Obstructive sleep apnea (adult) (pediatric): Secondary | ICD-10-CM | POA: Diagnosis not present

## 2017-03-30 ENCOUNTER — Telehealth (HOSPITAL_COMMUNITY): Payer: Self-pay | Admitting: Psychiatry

## 2017-03-30 NOTE — Telephone Encounter (Signed)
Reviewed UDS on 03/23/2017  Positive forlexapro, oxycodone, Xanax Otherwise, none detected.

## 2017-04-04 ENCOUNTER — Telehealth (HOSPITAL_COMMUNITY): Payer: Self-pay | Admitting: *Deleted

## 2017-04-04 NOTE — Telephone Encounter (Signed)
Will not increase ativan. Would be happy to discuss other option at her next visit.

## 2017-04-04 NOTE — Telephone Encounter (Signed)
Dr Modesta Messing  Per patient request is it possible to increase Ativan maybe try another medication?

## 2017-04-04 NOTE — Telephone Encounter (Signed)
Patient notified per Dr Modesta Messing: Would be happy to discuss other option at her next visit.

## 2017-04-04 NOTE — Telephone Encounter (Signed)
Dr Modesta Messing,  Patient called in stating that  She has stopped the Xanax & started the Ativan as instructed.  And that she isn't having good results with the Ativan. Stated that she is constantly having  high anxiety & that her blood pressure is elevated due to the anxiety. She is requesting to go  back on her Xanax.

## 2017-04-04 NOTE — Telephone Encounter (Signed)
I do not feel comfortable prescribing Xanax as discussed at her prior visit. Advise her to see a primary care for hypertension. Remind her of emergency resources (ED, urgent care) if needed. Will discuss more at the next visit.

## 2017-04-05 ENCOUNTER — Telehealth (HOSPITAL_COMMUNITY): Payer: Self-pay | Admitting: *Deleted

## 2017-04-05 ENCOUNTER — Telehealth (HOSPITAL_COMMUNITY): Payer: Self-pay | Admitting: Psychiatry

## 2017-04-05 NOTE — Telephone Encounter (Signed)
I am fine with her taking off ativan. If she has been taking ativan 1 mg three times a day, would recommend the following to avoid withdrawal symptoms: Day #1-3 Ativan 0.5 mg three times a day,  Day #4:  Ativan 0.5 mg twice a day, Day #5  Discontinue. If she were to have worsening tremors, palpitation, diaphoresis during this tapering down, it may be possible that she has withdrawal symptoms. Would recommend going back to the prior dose.

## 2017-04-05 NOTE — Telephone Encounter (Signed)
Dr Modesta Messing Patient called stating that she called & spoke with the pharmacist about stopping the Ativan. She stated that it seems to be causing more issues. And Pharmacist stated that since she is   on the low doe & takes only prn. That stopping wouldn't cause any harm if that's what she is   choosing to do. So this is to inform you of her decision to stop Ativan

## 2017-04-12 ENCOUNTER — Telehealth (HOSPITAL_COMMUNITY): Payer: Self-pay | Admitting: Psychiatry

## 2017-04-12 ENCOUNTER — Encounter (HOSPITAL_COMMUNITY): Payer: Self-pay | Admitting: Licensed Clinical Social Worker

## 2017-04-12 ENCOUNTER — Ambulatory Visit (INDEPENDENT_AMBULATORY_CARE_PROVIDER_SITE_OTHER): Payer: Medicare Other | Admitting: Licensed Clinical Social Worker

## 2017-04-12 DIAGNOSIS — Z62810 Personal history of physical and sexual abuse in childhood: Secondary | ICD-10-CM | POA: Diagnosis not present

## 2017-04-12 DIAGNOSIS — F1021 Alcohol dependence, in remission: Secondary | ICD-10-CM

## 2017-04-12 DIAGNOSIS — Z91410 Personal history of adult physical and sexual abuse: Secondary | ICD-10-CM | POA: Diagnosis not present

## 2017-04-12 DIAGNOSIS — F331 Major depressive disorder, recurrent, moderate: Secondary | ICD-10-CM

## 2017-04-12 NOTE — Telephone Encounter (Signed)
Received a refill request for ativan. I believe that she wanted to wean off this medication and she was given instruction. Could you contact with her and double check if she needs ativan refill?

## 2017-04-12 NOTE — Telephone Encounter (Signed)
Although I am fine with transfer her care,  I do not think Dr. Harrington Challenger is taking adult anymore. Would recommend transfer to [facility] unless Dr. Harrington Challenger accepts this patient.

## 2017-04-12 NOTE — Telephone Encounter (Signed)
Noted  
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
## 2017-04-12 NOTE — Telephone Encounter (Signed)
Spoke with patient & informed per Dr Modesta Messing: Received a refill request for ativan. I believe that she wanted to wean off this medication and she was given instruction. Could you contact with her and double check if she needs ativan refill?   "Patient stated she was having a reaction & would not like a refill"

## 2017-04-13 NOTE — Telephone Encounter (Signed)
I suggest the patient meet with Dr Modesta Messing to discuss her concerns about medications. They have only had 2 visits so far which is not enough time to establish a solid plan

## 2017-04-13 NOTE — Telephone Encounter (Signed)
Could you contact the patient and tell what Dr. Harrington Challenger advised (except that I met her only once). I would be happy to continue to work with her, although she can choose to go to [facility] if she wishes.

## 2017-04-24 DIAGNOSIS — G4733 Obstructive sleep apnea (adult) (pediatric): Secondary | ICD-10-CM | POA: Diagnosis not present

## 2017-04-25 ENCOUNTER — Ambulatory Visit (HOSPITAL_COMMUNITY): Payer: Medicare Other | Admitting: Psychiatry

## 2017-04-25 ENCOUNTER — Encounter (HOSPITAL_COMMUNITY): Payer: Self-pay | Admitting: Psychiatry

## 2017-04-25 VITALS — BP 119/77 | HR 88 | Ht 62.0 in | Wt 111.0 lb

## 2017-04-25 DIAGNOSIS — G4700 Insomnia, unspecified: Secondary | ICD-10-CM | POA: Diagnosis not present

## 2017-04-25 DIAGNOSIS — Z915 Personal history of self-harm: Secondary | ICD-10-CM

## 2017-04-25 DIAGNOSIS — F419 Anxiety disorder, unspecified: Secondary | ICD-10-CM

## 2017-04-25 DIAGNOSIS — F17210 Nicotine dependence, cigarettes, uncomplicated: Secondary | ICD-10-CM | POA: Diagnosis not present

## 2017-04-25 DIAGNOSIS — F1021 Alcohol dependence, in remission: Secondary | ICD-10-CM | POA: Diagnosis not present

## 2017-04-25 DIAGNOSIS — F331 Major depressive disorder, recurrent, moderate: Secondary | ICD-10-CM | POA: Diagnosis not present

## 2017-04-25 DIAGNOSIS — R450 Nervousness: Secondary | ICD-10-CM | POA: Diagnosis not present

## 2017-04-25 DIAGNOSIS — Z811 Family history of alcohol abuse and dependence: Secondary | ICD-10-CM | POA: Diagnosis not present

## 2017-04-25 MED ORDER — TRAZODONE HCL 50 MG PO TABS: ORAL_TABLET | ORAL | 0 refills | Status: DC

## 2017-04-25 MED ORDER — ESCITALOPRAM OXALATE 20 MG PO TABS: 20.0000 mg | ORAL_TABLET | Freq: Every day | ORAL | 0 refills | Status: AC

## 2017-04-25 MED ORDER — BUSPIRONE HCL 5 MG PO TABS: 5.0000 mg | ORAL_TABLET | Freq: Three times a day (TID) | ORAL | 0 refills | Status: DC
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
## 2017-04-25 NOTE — Patient Instructions (Signed)
1. (Discontinue ativan) 2. Start buspar 5 mg three times a day  3. Continue lexapro 20 mg daily  4. Decrease Xanax 1 mg twice a day for one week, then 1 mg per day for one week, then disconitnue  5. Start Trazodone 25-50 mg at night as needed for sleep,  6. Obtain record from Dr. Gerarda Fraction 7. Return to clinic in one month for 30 mins

## 2017-05-10 ENCOUNTER — Encounter: Payer: Self-pay | Admitting: Family Medicine

## 2017-05-10 ENCOUNTER — Other Ambulatory Visit: Payer: Self-pay

## 2017-05-10 ENCOUNTER — Ambulatory Visit: Payer: Medicare Other | Admitting: Family Medicine

## 2017-05-10 VITALS — BP 102/60 | HR 95 | Temp 97.9°F | Resp 16 | Ht 62.0 in | Wt 113.5 lb

## 2017-05-10 DIAGNOSIS — Z1159 Encounter for screening for other viral diseases: Secondary | ICD-10-CM | POA: Diagnosis not present

## 2017-05-10 DIAGNOSIS — G894 Chronic pain syndrome: Secondary | ICD-10-CM | POA: Diagnosis not present

## 2017-05-10 DIAGNOSIS — Z114 Encounter for screening for human immunodeficiency virus [HIV]: Secondary | ICD-10-CM | POA: Diagnosis not present

## 2017-05-10 DIAGNOSIS — G4733 Obstructive sleep apnea (adult) (pediatric): Secondary | ICD-10-CM | POA: Diagnosis not present

## 2017-05-10 DIAGNOSIS — J449 Chronic obstructive pulmonary disease, unspecified: Secondary | ICD-10-CM | POA: Diagnosis not present

## 2017-05-10 DIAGNOSIS — Z1231 Encounter for screening mammogram for malignant neoplasm of breast: Secondary | ICD-10-CM | POA: Diagnosis not present

## 2017-05-10 DIAGNOSIS — E782 Mixed hyperlipidemia: Secondary | ICD-10-CM

## 2017-05-10 DIAGNOSIS — Z1239 Encounter for other screening for malignant neoplasm of breast: Secondary | ICD-10-CM

## 2017-05-10 DIAGNOSIS — R5383 Other fatigue: Secondary | ICD-10-CM | POA: Diagnosis not present

## 2017-05-10 DIAGNOSIS — Z79899 Other long term (current) drug therapy: Secondary | ICD-10-CM

## 2017-05-10 DIAGNOSIS — E559 Vitamin D deficiency, unspecified: Secondary | ICD-10-CM | POA: Diagnosis not present

## 2017-05-10 NOTE — Progress Notes (Signed)
Patient ID: Heidi Mills. Heidi Mills, female    DOB: January 09, 1961, 57 y.o.   MRN: [id number]  Chief Complaint  Patient presents with  . Establish Care  . Sleep Apnea    Allergies Penicillins and Sulfa antibiotics  Subjective:   Heidi Mills. Welles is a 57 y.o. female who presents to [facility] today.  HPI Heidi Mills presents as a new patient visit today to establish care.  She is previously been evaluated by Dr. Gerarda Fraction, previous PCP.  She was reportedly discharged from their practice secondary to a violation in their opioid policy.  She reports that she has been on long-term narcotics and Xanax prescribed by her PCP secondary to chronic pain syndrome and anxiety.  She is also followed by Dr. Domenic Polite in Cardiology and Dr. Modesta Messing in Psychiatry.  Reports that she has not received oxycodone since October/2018.  She reports that she had been given these medications for years due to her spinal stenosis, scoliosis, and sciatica.  She reports that in October she was evaluated in the emergency department after being transferred there by EMS secondary to respiratory failure.  She was given the diagnosis of opioid overdose.  She reports that this was not the case.  She reports that it was secondary to her asthma that caused her to have symptoms of respiratory distress.  She does have a history of COPD and has smoked greater than one pack per day for over 35 years.  She would like to quit but is not interested in quitting at this time.  She reports that after that episode in the emergency department that she was discharged from her previous PCP.  She reports that received a prescription for oxycodone since that time.  She would like for me to give her a prescription for Xanax.  She is followed by Dr. Modesta Messing for her anxiety and depression.  She reports that she saw Dr. Modesta Messing recently and she has been working with her to wean off of her Xanax.  She gave her prescription for Ativan but reports that she had a  reaction to this medication.  She is now working with Dr. Modesta Messing to decrease the dosing of her Xanax.  Although she request a refill for Xanax from me today.  She also would like to be seen by pulmonary physician.  She has a history of COPD requiring hospitalization and a history of obstructive sleep apnea.  She reports that there is been a breakdown in communication between her primary care physician and the company that is supplying her sleep medicine supplies. Was started on CPAP machine for sleep apnea by Dr. Gerarda Fraction, he was supposed to review the readings and evaluation to give the authorization for CPAP permanently.  She reports that she needs to get a prescription for the supplies to get new ones.  She is followed by Dr. Domenic Polite and he gives her the cardiac medications that she takes.  She is given isosorbide, Lipitor, and Norvasc by Dr. Domenic Polite. She is given Lexapro and trazodone by Dr. Modesta Messing. She is not currently taking her COPD medicines as directed.  She reports that her energy is low and she feels fatigued.  She denies any dyspnea on exertion or shortness of breath.  She reports that she has not had blood work in a very long time.  She reports she was given vaccinations by Dr. Gerarda Fraction.  She reports that she would like something to help with her pain.  She reports she understands if she  cannot be given narcotics and does not necessarily want this medication.  She reports that she would like to be referred to an interventional pain practice where she might could receive acupuncture or shots necessary to help deal with her pain.  I do not have her records today which she reports her pain has been managed by her PCP in the past.  She denies any new symptoms of numbness, tingling, or weakness.  She reports her pain is been unchanged for many years.    Past Medical History:  Diagnosis Date  . Anxiety   . Chronic pain syndrome   . COPD (chronic obstructive pulmonary disease) ([place])   . Depression    . Essential hypertension   . Oxygen deficiency   . Sleep apnea     Past Surgical History:  Procedure Laterality Date  . CHOLECYSTECTOMY      Family History  Problem Relation Age of Onset  . Heart attack Mother   . CAD Mother   . Dementia Mother   . Alcohol abuse Maternal Uncle   . Alcohol abuse Paternal Uncle   . Diabetes Daughter   . Bipolar disorder Daughter      Social History   Socioeconomic History  . Marital status: Single    Spouse name: None  . Number of children: None  . Years of education: None  . Highest education level: None  Social Needs  . Financial resource strain: None  . Food insecurity - worry: None  . Food insecurity - inability: None  . Transportation needs - medical: None  . Transportation needs - non-medical: None  Occupational History  . None  Tobacco Use  . Smoking status: Current Every Day Smoker    Packs/day: 1.00    Types: Cigarettes    Start date: 12/21/1973  . Smokeless tobacco: Never Used  Substance and Sexual Activity  . Alcohol use: No  . Drug use: No  . Sexual activity: No  Other Topics Concern  . None  Social History Narrative   Lives in [place], [place]   On disability for COPD, back, mood.   Followed by Dr. Modesta Messing in Psychiatry.   Eats all food groups.    Lives with roommate, who is ex-husband.   Has one daughter, 2 grandchildren.   Enjoys reading and family.   Used to ride horses. Enjoys being outside.    Born in [place], [place].   Current Outpatient Medications on File Prior to Visit  Medication Sig Dispense Refill  . albuterol (PROAIR HFA) 108 (90 Base) MCG/ACT inhaler Inhale 2 puffs into the lungs every 4 (four) hours as needed for wheezing or shortness of breath.    Marland Kitchen amLODipine (NORVASC) 10 MG tablet Take 1 tablet (10 mg total) by mouth daily. 90 tablet 3  . aspirin EC 81 MG tablet Take 81 mg by mouth daily.    Marland Kitchen atorvastatin (LIPITOR) 10 MG tablet Take 10 mg by mouth daily.    . busPIRone (BUSPAR) 5 MG tablet Take 1  tablet (5 mg total) by mouth 3 (three) times daily. 90 tablet 0  . escitalopram (LEXAPRO) 20 MG tablet Take 1 tablet (20 mg total) by mouth daily. 30 tablet 0  . isosorbide dinitrate (ISORDIL) 10 MG tablet Take 1 tablet (10 mg total) by mouth 3 (three) times daily. 90 tablet 3  . Tiotropium Bromide-Olodaterol (STIOLTO RESPIMAT) 2.5-2.5 MCG/ACT AERS Inhale 2 Inhalers into the lungs daily.    . traZODone (DESYREL) 50 MG tablet 25-50 mg at night  as needed for sleep 30 tablet 0  . OXYCODONE HCL PO Take 30 mg by mouth every 4 (four) hours as needed.     No current facility-administered medications on file prior to visit.    Smokes ppd for over 30 years.  Review of Systems   Objective:   BP 102/60 (BP Location: Left Arm, Patient Position: Sitting, Cuff Size: Normal)   Pulse 95   Temp 97.9 F (36.6 C) (Temporal)   Resp 16   Ht 5\' 2"  (1.575 m)   Wt 113 lb 8 oz (51.5 kg)   SpO2 95%   BMI 20.76 kg/m   Physical Exam   Assessment and Plan   1. Chronic obstructive pulmonary disease, unspecified COPD type ([place]) For patient to pulmonary for spirometry/PFTs.  Also for management of her obstructive sleep apnea.  Smoking cessation recommended.  She defers at this time.  The 5 A's Model for treating Tobacco Use and Dependence was used today. I have identified and documented tobacco use status for this patient. I have urged the patient to quit tobacco use. At this time, the patient is unwilling and not ready to attempt to quit. I have provided patient with information regarding risks, cessation techniques, and interventions that might increase future attempts to quit smoking. I will plan on again addressing tobacco dependence at the next visit.  - Ambulatory referral to Pulmonology  2. OSA (obstructive sleep apnea) See above. - Ambulatory referral to Pulmonology  3. Screening for HIV (human immunodeficiency virus) Screening performed. - HIV antibody (with reflex)  4. Encounter for HCV  screening test for low risk patient Screening performed - Hepatitis C antibody  5. Screening for breast cancer  - MM Digital Screening; Future  6. Chronic pain syndrome For a place for interventional pain management.  I did tell patient that I was not referring her for initial treatment with narcotic medications. - Ambulatory referral to Pain Clinic  7. Mixed hyperlipidemia Continue statin medication.  Labs ordered. - Lipid panel  8. High risk medication use  - COMPLETE METABOLIC PANEL WITH GFR  9. Fatigue, unspecified type Discussed with patient that the diagnosis of fatigue could be multifactorial in etiology.  Did order baseline lab testing and screening today.  PHQ of 19 in the office which could definitely contribute to some of her symptoms.  Recommended continue follow-up with psychiatry.  I did tell patient that I will not refill benzodiazepines and that I agree with the treatment plan of Dr. Modesta Messing for tapering patient off this medication.  We did discuss that if she plans to get this medication from another primary care physician that she will not be able to be a patient at this practice.  She reports that she is willing to titrate off the medication is Dr. Modesta Messing recommended.  I did tell patient that I will plan to check the prescription database and if she is getting this medication from another physician, other than her regular psychiatrist, that she would not be following the recommended treatment plan would need to be discharged from her practice.  She voiced understanding.  She reports that she understands my concerns regarding the habit-forming nature of this medication and the fact that it does not help with treating the underlying mood disorder. - CBC with Differential/Platelet - TSH - VITAMIN D 25 Hydroxy (Vit-D Deficiency, Fractures) - Vitamin B12 Patient will follow-up in 1 month or sooner if needed.  OV was 45 minutes. Greater than 50% OV spent counseling and  coordinating care.  Counseling was given on medication use, compliance, reason for not prescribing her narcotics and benzodiazepine pills.  Patient was very insistent that she felt much better and her symptoms were controlled better when on these medications.  I did discuss that her psychiatrist is the primary provider for her mental health issues including her anxiety and that I would follow the recommended treatment plan as laid out by them. Records [facility].  No Follow-up on file. Caren Macadam, MD 05/10/2017

## 2017-05-10 NOTE — Patient Instructions (Signed)
Please schedule mammogram at check-out

## 2017-05-11 ENCOUNTER — Ambulatory Visit (HOSPITAL_COMMUNITY): Payer: Self-pay | Admitting: Licensed Clinical Social Worker

## 2017-05-14 LAB — COMPLETE METABOLIC PANEL WITH GFR
AG Ratio: 1.9 (calc) (ref 1.0–2.5)
ALBUMIN MSPROF: 4.1 g/dL (ref 3.6–5.1)
ALKALINE PHOSPHATASE (APISO): 56 U/L (ref 33–130)
ALT: 11 U/L (ref 6–29)
AST: 23 U/L (ref 10–35)
BUN: 12 mg/dL (ref 7–25)
CALCIUM: 9.2 mg/dL (ref 8.6–10.4)
CO2: 31 mmol/L (ref 20–32)
CREATININE: 0.98 mg/dL (ref 0.50–1.05)
Chloride: 103 mmol/L (ref 98–110)
GFR, EST AFRICAN AMERICAN: 74 mL/min/{1.73_m2} (ref >=60)
GFR, EST NON AFRICAN AMERICAN: 64 mL/min/{1.73_m2} (ref >=60)
GLUCOSE: 85 mg/dL (ref 65–99)
Globulin: 2.2 g/dL (calc) (ref 1.9–3.7)
Potassium: 4.6 mmol/L (ref 3.5–5.3)
Sodium: 139 mmol/L (ref 135–146)
TOTAL PROTEIN: 6.3 g/dL (ref 6.1–8.1)
Total Bilirubin: 0.3 mg/dL (ref 0.2–1.2)

## 2017-05-14 LAB — CBC WITH DIFFERENTIAL/PLATELET
BASOS PCT: 0.3 %
Basophils Absolute: 28 cells/uL (ref 0–200)
EOS ABS: 147 {cells}/uL (ref 15–500)
Eosinophils Relative: 1.6 %
HEMATOCRIT: 43.8 % (ref 35.0–45.0)
Hemoglobin: 15 g/dL (ref 11.7–15.5)
LYMPHS ABS: 2778 {cells}/uL (ref 850–3900)
MCH: 32.3 pg (ref 27.0–33.0)
MCHC: 34.2 g/dL (ref 32.0–36.0)
MCV: 94.4 fL (ref 80.0–100.0)
MPV: 10.3 fL (ref 7.5–12.5)
Monocytes Relative: 4.7 %
NEUTROS PCT: 63.2 %
Neutro Abs: 5814 cells/uL (ref 1500–7800)
Platelets: 190 10*3/uL (ref 140–400)
RBC: 4.64 10*6/uL (ref 3.80–5.10)
RDW: 12.5 % (ref 11.0–15.0)
Total Lymphocyte: 30.2 %
WBC: 9.2 10*3/uL (ref 3.8–10.8)
WBCMIX: 432 {cells}/uL (ref 200–950)

## 2017-05-14 LAB — HEPATITIS C ANTIBODY
Hepatitis C Ab: REACTIVE — AB
SIGNAL TO CUT-OFF: 5.59 — AB (ref <1.00)

## 2017-05-14 LAB — LIPID PANEL
CHOLESTEROL: 130 mg/dL (ref <200)
HDL: 48 mg/dL — ABNORMAL LOW (ref >50)
LDL Cholesterol (Calc): 66 mg/dL (calc)
Non-HDL Cholesterol (Calc): 82 mg/dL (calc) (ref <130)
Total CHOL/HDL Ratio: 2.7 (calc) (ref <5.0)
Triglycerides: 76 mg/dL (ref <150)

## 2017-05-14 LAB — TSH: TSH: 1.93 mIU/L (ref 0.40–4.50)

## 2017-05-14 LAB — HCV RNA,QUANTITATIVE REAL TIME PCR
HCV Quantitative Log: <1.18 Log IU/mL
HCV RNA, PCR, QN: NOT DETECTED [IU]/mL

## 2017-05-14 LAB — VITAMIN D 25 HYDROXY (VIT D DEFICIENCY, FRACTURES): Vit D, 25-Hydroxy: 28 ng/mL — ABNORMAL LOW (ref 30–100)

## 2017-05-14 LAB — VITAMIN B12: Vitamin B-12: 457 pg/mL (ref 200–1100)

## 2017-05-14 LAB — HIV ANTIBODY (ROUTINE TESTING W REFLEX): HIV: NONREACTIVE

## 2017-05-16 ENCOUNTER — Telehealth: Payer: Self-pay | Admitting: Family Medicine

## 2017-05-16 DIAGNOSIS — I10 Essential (primary) hypertension: Secondary | ICD-10-CM | POA: Diagnosis not present

## 2017-05-16 DIAGNOSIS — G4733 Obstructive sleep apnea (adult) (pediatric): Secondary | ICD-10-CM | POA: Diagnosis not present

## 2017-05-16 NOTE — Telephone Encounter (Signed)
Received a fax from [place] requesting office notes from patient's recent visit. They are requesting the notes say specifically "Patient has been using and benefiting from CPAP."  It looks like the office notes states that Dr. Gerarda Fraction was supposed to interpret sleep study and act accordingly but that was not done prior to her leaving. I am not sure what we need to do at this time.

## 2017-05-17 ENCOUNTER — Encounter: Payer: Self-pay | Admitting: Family Medicine

## 2017-05-17 NOTE — Telephone Encounter (Signed)
Faxed request for this.

## 2017-05-17 NOTE — Telephone Encounter (Signed)
I will need copy of her sleep study results before we can proceed. She needs to get them sent to [place] from her previous PCP or she has to have another test. I cannot order CPAP or supplies without evidence of needs for the device. Please advise patient of this and ask her to ensure the study gets send to our office. [name] Mannie Stabile, MD

## 2017-05-22 DIAGNOSIS — G4733 Obstructive sleep apnea (adult) (pediatric): Secondary | ICD-10-CM | POA: Diagnosis not present

## 2017-05-23 DIAGNOSIS — M545 Low back pain: Secondary | ICD-10-CM | POA: Diagnosis not present

## 2017-05-23 DIAGNOSIS — M5489 Other dorsalgia: Secondary | ICD-10-CM | POA: Diagnosis not present

## 2017-05-23 DIAGNOSIS — Z79899 Other long term (current) drug therapy: Secondary | ICD-10-CM | POA: Diagnosis not present

## 2017-05-23 DIAGNOSIS — Z79891 Long term (current) use of opiate analgesic: Secondary | ICD-10-CM | POA: Diagnosis not present

## 2017-05-23 DIAGNOSIS — G894 Chronic pain syndrome: Secondary | ICD-10-CM | POA: Diagnosis not present

## 2017-05-24 ENCOUNTER — Other Ambulatory Visit: Payer: Self-pay | Admitting: Pain Medicine

## 2017-05-24 ENCOUNTER — Ambulatory Visit (HOSPITAL_COMMUNITY): Payer: Self-pay | Admitting: Psychiatry

## 2017-05-24 ENCOUNTER — Encounter: Payer: Self-pay | Admitting: Family Medicine

## 2017-05-24 DIAGNOSIS — M5489 Other dorsalgia: Secondary | ICD-10-CM

## 2017-05-24 DIAGNOSIS — M545 Low back pain: Secondary | ICD-10-CM

## 2017-05-25 ENCOUNTER — Ambulatory Visit (HOSPITAL_COMMUNITY): Payer: Self-pay

## 2017-05-25 ENCOUNTER — Ambulatory Visit (HOSPITAL_COMMUNITY): Payer: Self-pay | Admitting: Licensed Clinical Social Worker

## 2017-05-30 ENCOUNTER — Other Ambulatory Visit: Payer: Self-pay | Admitting: Cardiology

## 2017-05-30 DIAGNOSIS — Z0000 Encounter for general adult medical examination without abnormal findings: Secondary | ICD-10-CM | POA: Diagnosis not present

## 2017-05-30 DIAGNOSIS — I209 Angina pectoris, unspecified: Secondary | ICD-10-CM | POA: Diagnosis not present

## 2017-05-30 DIAGNOSIS — I10 Essential (primary) hypertension: Secondary | ICD-10-CM | POA: Diagnosis not present

## 2017-05-30 DIAGNOSIS — G4733 Obstructive sleep apnea (adult) (pediatric): Secondary | ICD-10-CM | POA: Diagnosis not present

## 2017-05-30 DIAGNOSIS — J449 Chronic obstructive pulmonary disease, unspecified: Secondary | ICD-10-CM | POA: Diagnosis not present

## 2017-05-30 DIAGNOSIS — G4730 Sleep apnea, unspecified: Secondary | ICD-10-CM | POA: Diagnosis not present

## 2017-05-30 DIAGNOSIS — E782 Mixed hyperlipidemia: Secondary | ICD-10-CM | POA: Diagnosis not present

## 2017-05-30 DIAGNOSIS — Z01419 Encounter for gynecological examination (general) (routine) without abnormal findings: Secondary | ICD-10-CM | POA: Diagnosis not present

## 2017-05-31 ENCOUNTER — Ambulatory Visit
Admission: RE | Admit: 2017-05-31 | Discharge: 2017-05-31 | Disposition: A | Discharge status: Home / Self Care | Payer: Medicare Other | Source: Ambulatory Visit | Attending: Pain Medicine | Admitting: Pain Medicine

## 2017-05-31 DIAGNOSIS — M5124 Other intervertebral disc displacement, thoracic region: Secondary | ICD-10-CM | POA: Diagnosis not present

## 2017-05-31 DIAGNOSIS — M5489 Other dorsalgia: Secondary | ICD-10-CM

## 2017-05-31 DIAGNOSIS — M545 Low back pain: Secondary | ICD-10-CM

## 2017-05-31 DIAGNOSIS — M48061 Spinal stenosis, lumbar region without neurogenic claudication: Secondary | ICD-10-CM | POA: Diagnosis not present

## 2017-06-02 ENCOUNTER — Encounter (HOSPITAL_COMMUNITY): Payer: Self-pay | Admitting: Licensed Clinical Social Worker

## 2017-06-02 ENCOUNTER — Ambulatory Visit (HOSPITAL_COMMUNITY): Payer: Medicare Other | Admitting: Licensed Clinical Social Worker

## 2017-06-02 DIAGNOSIS — J449 Chronic obstructive pulmonary disease, unspecified: Secondary | ICD-10-CM | POA: Diagnosis not present

## 2017-06-02 DIAGNOSIS — F331 Major depressive disorder, recurrent, moderate: Secondary | ICD-10-CM

## 2017-06-02 DIAGNOSIS — Z62820 Parent-biological child conflict: Secondary | ICD-10-CM | POA: Diagnosis not present

## 2017-06-02 NOTE — Progress Notes (Signed)
   THERAPIST PROGRESS NOTE  Session Time: 3:00 pm -3:45 pm  Participation Level: Active  Behavioral Response: CasualAlertDepressed  Type of Therapy: Individual Therapy  Treatment Goals addressed: Coping  Interventions: CBT and Solution Focused  Summary: Heidi Mills. Heidi Mills is a 57 y.o. female who presents oriented x5 (person, place, situation, time and object), alert, depressed, casually dressed, appropriately groomed, thin, average height and cooperative for an assessment on a referral from Dr. Modesta Messing to address mood and anxiety. Patient has a history of medical treatment history including chronic pain, hypertension and COPD. Patient has a history of mental health treatment including outpatient therapy, substance abuse treatment and medication management. Patient denies symptoms of mania. Patient denies suicidal and homicidal ideations. Patient denies psychosis including auditory and visual hallucinations. Patient denies current substance abuse. Patient denies elopement.  Physically: Patient is experiencing aches and pains. She is having difficulty breathing due to COPD. Patient has been feeling tired but has a CPAP machine that she is going to start using.  Spiritually/values: Patient is spiritually healthy. She prays daily and tries to live by the golden rule.  Relationships: Patient has a strained relationship with her daughter. Her daughter has been yelling at her and trying to argue. Patient asked her daughter to leave the home. Her daughter continues to come to the home for visitation with her children. After discussion, patient explained that she does not like to argue. She calmly tells her daughter that she is not going to argue with her or that it is not an appropriate time to argue.  Emotionally/Mentally/Behavior: Patient reported that she was feeling frustrated at times. She was frustrated about being taken off of xanax by Dr. Modesta Messing. She decided to not see her anymore. Patient found a  doctor that would prescribe her xanax. She had been seeing another family doctor who wouldn't prescribe her xanax because they wanted a psychiatrist to prescribe it. Patient received a letter from that doctors office, dismissing her because they thought she got xanax from two different providers (Dr. Modesta Messing and new family doctor) which patient states is not the case. Patient has identified she can only control her behavior. She can't control her daughters or the doctor's behavior.   Patient engaged in session. She responded well to interventions. Patient continues to meet criteria for Major depressive disorder, recurrent episode with anxious distress. Patient will continue in outpatient therapy due to being the least restrictive service to meet her needs. Patient made no progress on her goals at this time.   Suicidal/Homicidal: Negativewithout intent/plan  Therapist Response: Therapist reviewed patient's recent thoughts and behaviors. Therapist utilized CBT to address mood. Therapist processed patient's feelings to identify triggers for depression. Therapist discussed with patient being responsible for her behavior and the inability to control others behaviors.   Plan: Return again in 3 weeks.  Diagnosis: Axis I: Major depressive disorder, recurrent episode, moderate with anxious distress    Axis II: No diagnosis    Glori Bickers, LCSW 06/02/2017

## 2017-06-09 ENCOUNTER — Institutional Professional Consult (permissible substitution): Payer: Self-pay | Admitting: Emergency Medicine

## 2017-06-16 ENCOUNTER — Encounter: Payer: Self-pay | Admitting: Family Medicine

## 2017-06-20 DIAGNOSIS — M47814 Spondylosis without myelopathy or radiculopathy, thoracic region: Secondary | ICD-10-CM | POA: Diagnosis not present

## 2017-06-20 DIAGNOSIS — G894 Chronic pain syndrome: Secondary | ICD-10-CM | POA: Diagnosis not present

## 2017-06-20 DIAGNOSIS — M47817 Spondylosis without myelopathy or radiculopathy, lumbosacral region: Secondary | ICD-10-CM | POA: Diagnosis not present

## 2017-06-20 DIAGNOSIS — M5136 Other intervertebral disc degeneration, lumbar region: Secondary | ICD-10-CM | POA: Diagnosis not present

## 2017-06-22 DIAGNOSIS — G4733 Obstructive sleep apnea (adult) (pediatric): Secondary | ICD-10-CM | POA: Diagnosis not present

## 2017-06-29 DIAGNOSIS — M47817 Spondylosis without myelopathy or radiculopathy, lumbosacral region: Secondary | ICD-10-CM | POA: Diagnosis not present

## 2017-07-06 ENCOUNTER — Ambulatory Visit (HOSPITAL_COMMUNITY): Payer: Medicare Other | Admitting: Licensed Clinical Social Worker

## 2017-07-13 DIAGNOSIS — Z79899 Other long term (current) drug therapy: Secondary | ICD-10-CM | POA: Diagnosis not present

## 2017-07-13 DIAGNOSIS — M47817 Spondylosis without myelopathy or radiculopathy, lumbosacral region: Secondary | ICD-10-CM | POA: Diagnosis not present

## 2017-07-13 DIAGNOSIS — Z79891 Long term (current) use of opiate analgesic: Secondary | ICD-10-CM | POA: Diagnosis not present

## 2017-07-13 DIAGNOSIS — G894 Chronic pain syndrome: Secondary | ICD-10-CM | POA: Diagnosis not present

## 2017-07-18 ENCOUNTER — Institutional Professional Consult (permissible substitution): Payer: Self-pay | Admitting: Emergency Medicine

## 2017-07-22 DIAGNOSIS — G4733 Obstructive sleep apnea (adult) (pediatric): Secondary | ICD-10-CM | POA: Diagnosis not present

## 2017-07-27 ENCOUNTER — Ambulatory Visit (HOSPITAL_COMMUNITY): Payer: Medicare Other | Admitting: Licensed Clinical Social Worker

## 2017-08-02 DIAGNOSIS — M47817 Spondylosis without myelopathy or radiculopathy, lumbosacral region: Secondary | ICD-10-CM | POA: Diagnosis not present

## 2017-08-22 DIAGNOSIS — M47817 Spondylosis without myelopathy or radiculopathy, lumbosacral region: Secondary | ICD-10-CM | POA: Diagnosis not present

## 2017-08-22 DIAGNOSIS — G894 Chronic pain syndrome: Secondary | ICD-10-CM | POA: Diagnosis not present

## 2017-08-22 DIAGNOSIS — G4733 Obstructive sleep apnea (adult) (pediatric): Secondary | ICD-10-CM | POA: Diagnosis not present

## 2017-08-22 DIAGNOSIS — M47814 Spondylosis without myelopathy or radiculopathy, thoracic region: Secondary | ICD-10-CM | POA: Diagnosis not present

## 2017-09-02 DIAGNOSIS — G894 Chronic pain syndrome: Secondary | ICD-10-CM | POA: Diagnosis not present

## 2017-09-02 DIAGNOSIS — M545 Low back pain: Secondary | ICD-10-CM | POA: Diagnosis not present

## 2017-09-05 DIAGNOSIS — M47817 Spondylosis without myelopathy or radiculopathy, lumbosacral region: Secondary | ICD-10-CM | POA: Diagnosis not present

## 2017-09-05 DIAGNOSIS — M5136 Other intervertebral disc degeneration, lumbar region: Secondary | ICD-10-CM | POA: Diagnosis not present

## 2017-09-19 DIAGNOSIS — G894 Chronic pain syndrome: Secondary | ICD-10-CM | POA: Diagnosis not present

## 2017-09-19 DIAGNOSIS — M79606 Pain in leg, unspecified: Secondary | ICD-10-CM | POA: Diagnosis not present

## 2017-09-19 DIAGNOSIS — Z79899 Other long term (current) drug therapy: Secondary | ICD-10-CM | POA: Diagnosis not present

## 2017-09-19 DIAGNOSIS — Z79891 Long term (current) use of opiate analgesic: Secondary | ICD-10-CM | POA: Diagnosis not present

## 2017-09-19 DIAGNOSIS — M47816 Spondylosis without myelopathy or radiculopathy, lumbar region: Secondary | ICD-10-CM | POA: Diagnosis not present

## 2017-09-21 DIAGNOSIS — G4733 Obstructive sleep apnea (adult) (pediatric): Secondary | ICD-10-CM | POA: Diagnosis not present

## 2017-09-25 ENCOUNTER — Other Ambulatory Visit: Payer: Self-pay | Admitting: Cardiology

## 2017-10-19 ENCOUNTER — Institutional Professional Consult (permissible substitution): Payer: Self-pay | Admitting: Pulmonary Disease

## 2017-10-20 DIAGNOSIS — G894 Chronic pain syndrome: Secondary | ICD-10-CM | POA: Diagnosis not present

## 2017-10-20 DIAGNOSIS — M5137 Other intervertebral disc degeneration, lumbosacral region: Secondary | ICD-10-CM | POA: Diagnosis not present

## 2017-10-20 DIAGNOSIS — M79606 Pain in leg, unspecified: Secondary | ICD-10-CM | POA: Diagnosis not present

## 2017-10-20 DIAGNOSIS — M47816 Spondylosis without myelopathy or radiculopathy, lumbar region: Secondary | ICD-10-CM | POA: Diagnosis not present

## 2017-10-22 DIAGNOSIS — G4733 Obstructive sleep apnea (adult) (pediatric): Secondary | ICD-10-CM | POA: Diagnosis not present

## 2017-11-17 DIAGNOSIS — Z79899 Other long term (current) drug therapy: Secondary | ICD-10-CM | POA: Diagnosis not present

## 2017-11-17 DIAGNOSIS — M533 Sacrococcygeal disorders, not elsewhere classified: Secondary | ICD-10-CM | POA: Diagnosis not present

## 2017-11-17 DIAGNOSIS — M47816 Spondylosis without myelopathy or radiculopathy, lumbar region: Secondary | ICD-10-CM | POA: Diagnosis not present

## 2017-11-17 DIAGNOSIS — G894 Chronic pain syndrome: Secondary | ICD-10-CM | POA: Diagnosis not present

## 2017-11-17 DIAGNOSIS — M79606 Pain in leg, unspecified: Secondary | ICD-10-CM | POA: Diagnosis not present

## 2017-11-17 DIAGNOSIS — Z79891 Long term (current) use of opiate analgesic: Secondary | ICD-10-CM | POA: Diagnosis not present

## 2017-11-24 DIAGNOSIS — I10 Essential (primary) hypertension: Secondary | ICD-10-CM | POA: Diagnosis not present

## 2017-11-24 DIAGNOSIS — E782 Mixed hyperlipidemia: Secondary | ICD-10-CM | POA: Diagnosis not present

## 2017-11-24 DIAGNOSIS — J449 Chronic obstructive pulmonary disease, unspecified: Secondary | ICD-10-CM | POA: Diagnosis not present

## 2017-11-24 DIAGNOSIS — I209 Angina pectoris, unspecified: Secondary | ICD-10-CM | POA: Diagnosis not present

## 2017-11-24 DIAGNOSIS — G4733 Obstructive sleep apnea (adult) (pediatric): Secondary | ICD-10-CM | POA: Diagnosis not present

## 2017-11-28 DIAGNOSIS — I209 Angina pectoris, unspecified: Secondary | ICD-10-CM | POA: Diagnosis not present

## 2017-11-28 DIAGNOSIS — J441 Chronic obstructive pulmonary disease with (acute) exacerbation: Secondary | ICD-10-CM | POA: Diagnosis not present

## 2017-11-28 DIAGNOSIS — Z0001 Encounter for general adult medical examination with abnormal findings: Secondary | ICD-10-CM | POA: Diagnosis not present

## 2017-11-28 DIAGNOSIS — E782 Mixed hyperlipidemia: Secondary | ICD-10-CM | POA: Diagnosis not present

## 2017-11-28 DIAGNOSIS — I10 Essential (primary) hypertension: Secondary | ICD-10-CM | POA: Diagnosis not present

## 2017-11-29 ENCOUNTER — Ambulatory Visit (HOSPITAL_COMMUNITY): Payer: Self-pay

## 2017-11-29 ENCOUNTER — Other Ambulatory Visit (HOSPITAL_COMMUNITY): Payer: Self-pay | Admitting: Internal Medicine

## 2017-11-29 DIAGNOSIS — Z1231 Encounter for screening mammogram for malignant neoplasm of breast: Secondary | ICD-10-CM

## 2017-12-02 ENCOUNTER — Ambulatory Visit (HOSPITAL_COMMUNITY): Payer: Self-pay

## 2017-12-14 DIAGNOSIS — M79606 Pain in leg, unspecified: Secondary | ICD-10-CM | POA: Diagnosis not present

## 2017-12-14 DIAGNOSIS — M47816 Spondylosis without myelopathy or radiculopathy, lumbar region: Secondary | ICD-10-CM | POA: Diagnosis not present

## 2017-12-14 DIAGNOSIS — G894 Chronic pain syndrome: Secondary | ICD-10-CM | POA: Diagnosis not present

## 2017-12-14 DIAGNOSIS — M533 Sacrococcygeal disorders, not elsewhere classified: Secondary | ICD-10-CM | POA: Diagnosis not present

## 2017-12-14 DIAGNOSIS — M5136 Other intervertebral disc degeneration, lumbar region: Secondary | ICD-10-CM | POA: Diagnosis not present

## 2017-12-14 DIAGNOSIS — M47817 Spondylosis without myelopathy or radiculopathy, lumbosacral region: Secondary | ICD-10-CM | POA: Diagnosis not present

## 2017-12-29 DIAGNOSIS — G4733 Obstructive sleep apnea (adult) (pediatric): Secondary | ICD-10-CM | POA: Diagnosis not present

## 2018-01-11 DIAGNOSIS — Z79899 Other long term (current) drug therapy: Secondary | ICD-10-CM | POA: Diagnosis not present

## 2018-01-11 DIAGNOSIS — M5137 Other intervertebral disc degeneration, lumbosacral region: Secondary | ICD-10-CM | POA: Diagnosis not present

## 2018-01-11 DIAGNOSIS — M533 Sacrococcygeal disorders, not elsewhere classified: Secondary | ICD-10-CM | POA: Diagnosis not present

## 2018-01-11 DIAGNOSIS — G894 Chronic pain syndrome: Secondary | ICD-10-CM | POA: Diagnosis not present

## 2018-01-11 DIAGNOSIS — M47816 Spondylosis without myelopathy or radiculopathy, lumbar region: Secondary | ICD-10-CM | POA: Diagnosis not present

## 2018-01-11 DIAGNOSIS — Z79891 Long term (current) use of opiate analgesic: Secondary | ICD-10-CM | POA: Diagnosis not present

## 2018-01-23 DIAGNOSIS — M545 Low back pain: Secondary | ICD-10-CM | POA: Diagnosis not present

## 2018-01-23 DIAGNOSIS — I10 Essential (primary) hypertension: Secondary | ICD-10-CM | POA: Diagnosis not present

## 2018-01-23 DIAGNOSIS — I209 Angina pectoris, unspecified: Secondary | ICD-10-CM | POA: Diagnosis not present

## 2018-02-06 DIAGNOSIS — M47817 Spondylosis without myelopathy or radiculopathy, lumbosacral region: Secondary | ICD-10-CM | POA: Diagnosis not present

## 2018-02-06 DIAGNOSIS — M79606 Pain in leg, unspecified: Secondary | ICD-10-CM | POA: Diagnosis not present

## 2018-02-06 DIAGNOSIS — M5136 Other intervertebral disc degeneration, lumbar region: Secondary | ICD-10-CM | POA: Diagnosis not present

## 2018-02-06 DIAGNOSIS — M533 Sacrococcygeal disorders, not elsewhere classified: Secondary | ICD-10-CM | POA: Diagnosis not present

## 2018-02-06 DIAGNOSIS — G894 Chronic pain syndrome: Secondary | ICD-10-CM | POA: Diagnosis not present

## 2018-02-06 DIAGNOSIS — M47816 Spondylosis without myelopathy or radiculopathy, lumbar region: Secondary | ICD-10-CM | POA: Diagnosis not present

## 2018-02-15 ENCOUNTER — Encounter: Payer: Self-pay | Admitting: Cardiology

## 2018-02-15 ENCOUNTER — Ambulatory Visit: Payer: Medicare Other | Admitting: Cardiology

## 2018-02-15 VITALS — BP 144/88 | HR 94 | Ht 62.0 in | Wt 118.0 lb

## 2018-02-15 DIAGNOSIS — I10 Essential (primary) hypertension: Secondary | ICD-10-CM

## 2018-02-15 DIAGNOSIS — Z720 Tobacco use: Secondary | ICD-10-CM | POA: Diagnosis not present

## 2018-02-15 DIAGNOSIS — M79604 Pain in right leg: Secondary | ICD-10-CM | POA: Diagnosis not present

## 2018-02-15 DIAGNOSIS — M79605 Pain in left leg: Secondary | ICD-10-CM | POA: Diagnosis not present

## 2018-02-15 DIAGNOSIS — Z87898 Personal history of other specified conditions: Secondary | ICD-10-CM

## 2018-02-15 NOTE — Patient Instructions (Signed)
Medication Instructions:  Your physician recommends that you continue on your current medications as directed. Please refer to the Current Medication list given to you today.   Labwork: NONE  Testing/Procedures: Your physician has requested that you have an ankle brachial index (ABI). During this test an ultrasound and blood pressure cuff are used to evaluate the arteries that supply the arms and legs with blood. Allow thirty minutes for this exam. There are no restrictions or special instructions.    Follow-Up: Your physician recommends that you schedule a follow-up appointment in: AS NEEDED    Any Other Special Instructions Will Be Listed Below (If Applicable).     If you need a refill on your cardiac medications before your next appointment, please call your pharmacy.   

## 2018-02-15 NOTE — Progress Notes (Signed)
Cardiology Office Note  Date: 02/15/2018   ID: Heidi Mills, DOB [birth year], MRN [id number]  PCP: Celene Squibb, MD  Primary Cardiologist: Rozann Lesches, MD   Chief Complaint  Patient presents with  . Cardiac follow-up    History of Present Illness: Heidi Mills is a 57 y.o. female last seen in December 2018.  She presents for a routine visit.  Does not report any progressive chest pain symptoms, has stable dyspnea on exertion with COPD.  Unfortunately, continues to smoke cigarettes, has not been able to quit.  Does describe leg pain although has chronic back trouble, pain injections, also leg cramps.  She was concerned about the possibility of PAD.  She is following now with Dr. Nevada Crane for primary care.  I reviewed her medications which include aspirin, Norvasc, Lipitor, and Isordil.  Previous ischemic testing was reassuring.  I personally reviewed her ECG today which shows normal sinus rhythm with nonspecific ST changes.  Past Medical History:  Diagnosis Date  . Anxiety   . Chronic pain syndrome   . COPD (chronic obstructive pulmonary disease) ([place])   . Depression   . Essential hypertension   . Oxygen deficiency   . Sleep apnea     Past Surgical History:  Procedure Laterality Date  . CHOLECYSTECTOMY      Current Outpatient Medications  Medication Sig Dispense Refill  . albuterol (PROAIR HFA) 108 (90 Base) MCG/ACT inhaler Inhale 2 puffs into the lungs every 4 (four) hours as needed for wheezing or shortness of breath.    . ALPRAZolam (XANAX) 1 MG tablet Take 1 mg by mouth 3 (three) times daily as needed for anxiety.    Marland Kitchen amLODipine (NORVASC) 10 MG tablet Take 1 tablet (10 mg total) by mouth daily. 90 tablet 3  . aspirin EC 81 MG tablet Take 81 mg by mouth daily.    Marland Kitchen atorvastatin (LIPITOR) 10 MG tablet Take 10 mg by mouth daily.    . busPIRone (BUSPAR) 5 MG tablet Take 1 tablet (5 mg total) by mouth 3 (three) times daily. 90 tablet 0  . escitalopram (LEXAPRO) 20  MG tablet Take 1 tablet (20 mg total) by mouth daily. 30 tablet 0  . isosorbide dinitrate (ISORDIL) 10 MG tablet TAKE ONE TABLET BY MOUTH THREE TIMES DAILY 270 tablet 3  . OXYCODONE HCL PO Take 15 mg by mouth every 4 (four) hours as needed.     . Tiotropium Bromide-Olodaterol (STIOLTO RESPIMAT) 2.5-2.5 MCG/ACT AERS Inhale 2 Inhalers into the lungs daily.     No current facility-administered medications for this visit.    Allergies:  Eggs or egg-derived products; Penicillins; and Sulfa antibiotics   Social History: The patient  reports that she has been smoking cigarettes. She started smoking about 44 years ago. She has been smoking about 1.00 pack per day. She has never used smokeless tobacco. She reports that she does not drink alcohol or use drugs.   ROS:  Please see the history of present illness. Otherwise, complete review of systems is positive for none.  All other systems are reviewed and negative.   Physical Exam: VS:  BP (!) 144/88 (BP Location: Left Arm)   Pulse 94   Ht 5\' 2"  (1.575 m)   Wt 118 lb (53.5 kg)   SpO2 94%   BMI 21.58 kg/m , BMI Body mass index is 21.58 kg/m.  Wt Readings from Last 3 Encounters:  02/15/18 118 lb (53.5 kg)  05/10/17 113 lb 8 oz (  51.5 kg)  02/09/17 111 lb (50.3 kg)    General: Thin woman, appears comfortable at rest. HEENT: Conjunctiva and lids normal, oropharynx clear with poor dentition. Neck: Supple, no elevated JVP or carotid bruits, no thyromegaly. Lungs: Decreased breath sounds without wheezing, nonlabored breathing at rest. Cardiac: Regular rate and rhythm, no S3 or significant systolic murmur. Abdomen: Soft, nontender, bowel sounds present. Extremities: No pitting edema, distal pulses 1-2+. Skin: Warm and dry. Musculoskeletal: No kyphosis. Neuropsychiatric: Alert and oriented x3, affect grossly appropriate.  ECG: I personally reviewed the tracing from 02/09/2017 which showed normal sinus rhythm.  Recent Labwork: 05/10/2017: ALT 11;  AST 23; BUN 12; Creat 0.98; Hemoglobin 15.0; Platelets 190; Potassium 4.6; Sodium 139; TSH 1.93     Component Value Date/Time   CHOL 130 05/10/2017 1159   TRIG 76 05/10/2017 1159   HDL 48 (L) 05/10/2017 1159   CHOLHDL 2.7 05/10/2017 1159   LDLCALC 66 05/10/2017 1159    Other Studies Reviewed Today:  Exercise echocardiogram 04/17/2015 ([facility]): No diagnostic ST segment changes at 82% MPHR.  No inducible wall motion abnormalities to indicate ischemia.  Assessment and Plan:  1.  History of intermittent chest pain, reassuring ischemic testing as outlined above, no significant change in ECG.  Would recommend risk factor modification and observation.  She will continue to follow with Dr. Nevada Crane.  2.  Bilateral leg pain, suspect neuropathic although she has not been assessed for PAD and has a longstanding history of tobacco abuse.  Obtain lower extremity ABIs.  3.  Tobacco abuse.  Smoking cessation discussed.  4.  Essential hypertension, on Norvasc.  Keep follow-up with Dr. Nevada Crane.  Current medicines were reviewed with the patient today.   Orders Placed This Encounter  Procedures  . US ARTERIAL SEG MULTIPLE LE (ABI, SEGMENTAL PRESSURES, PVR'S)  . EKG 12-Lead  . EKG 12-Lead    Disposition: Follow-up vascular studies.  Keep follow-up with Dr. Nevada Crane.  Signed, Satira Sark, MD, [facility] 02/15/2018 2:47 PM    [facility] [address], [place], [place] [address] Phone: [phone]; Fax: [phone]

## 2018-02-17 ENCOUNTER — Ambulatory Visit (HOSPITAL_COMMUNITY)
Admission: RE | Admit: 2018-02-17 | Discharge: 2018-02-17 | Disposition: A | Discharge status: Home / Self Care | Payer: Medicare Other | Source: Ambulatory Visit | Attending: Cardiology | Admitting: Cardiology

## 2018-02-17 DIAGNOSIS — E785 Hyperlipidemia, unspecified: Secondary | ICD-10-CM | POA: Diagnosis not present

## 2018-02-17 DIAGNOSIS — M79605 Pain in left leg: Secondary | ICD-10-CM | POA: Diagnosis not present

## 2018-02-17 DIAGNOSIS — M79604 Pain in right leg: Secondary | ICD-10-CM | POA: Insufficient documentation

## 2018-02-17 DIAGNOSIS — I10 Essential (primary) hypertension: Secondary | ICD-10-CM | POA: Diagnosis not present

## 2018-02-23 ENCOUNTER — Other Ambulatory Visit: Payer: Self-pay | Admitting: Cardiology

## 2018-03-22 DIAGNOSIS — M533 Sacrococcygeal disorders, not elsewhere classified: Secondary | ICD-10-CM | POA: Diagnosis not present

## 2018-03-22 DIAGNOSIS — M5136 Other intervertebral disc degeneration, lumbar region: Secondary | ICD-10-CM | POA: Diagnosis not present

## 2018-03-22 DIAGNOSIS — M47816 Spondylosis without myelopathy or radiculopathy, lumbar region: Secondary | ICD-10-CM | POA: Diagnosis not present

## 2018-03-22 DIAGNOSIS — M47817 Spondylosis without myelopathy or radiculopathy, lumbosacral region: Secondary | ICD-10-CM | POA: Diagnosis not present

## 2018-03-22 DIAGNOSIS — M79606 Pain in leg, unspecified: Secondary | ICD-10-CM | POA: Diagnosis not present

## 2018-03-22 DIAGNOSIS — G894 Chronic pain syndrome: Secondary | ICD-10-CM | POA: Diagnosis not present

## 2018-04-10 DIAGNOSIS — I10 Essential (primary) hypertension: Secondary | ICD-10-CM | POA: Diagnosis not present

## 2018-04-10 DIAGNOSIS — E782 Mixed hyperlipidemia: Secondary | ICD-10-CM | POA: Diagnosis not present

## 2018-04-12 DIAGNOSIS — E782 Mixed hyperlipidemia: Secondary | ICD-10-CM | POA: Diagnosis not present

## 2018-04-12 DIAGNOSIS — I10 Essential (primary) hypertension: Secondary | ICD-10-CM | POA: Diagnosis not present

## 2018-04-12 DIAGNOSIS — I209 Angina pectoris, unspecified: Secondary | ICD-10-CM | POA: Diagnosis not present

## 2018-04-12 DIAGNOSIS — G4733 Obstructive sleep apnea (adult) (pediatric): Secondary | ICD-10-CM | POA: Diagnosis not present

## 2018-04-12 DIAGNOSIS — J449 Chronic obstructive pulmonary disease, unspecified: Secondary | ICD-10-CM | POA: Diagnosis not present

## 2018-04-18 DIAGNOSIS — I10 Essential (primary) hypertension: Secondary | ICD-10-CM | POA: Diagnosis not present

## 2018-04-18 DIAGNOSIS — E782 Mixed hyperlipidemia: Secondary | ICD-10-CM | POA: Diagnosis not present

## 2018-04-18 DIAGNOSIS — G4733 Obstructive sleep apnea (adult) (pediatric): Secondary | ICD-10-CM | POA: Diagnosis not present

## 2018-04-18 DIAGNOSIS — J449 Chronic obstructive pulmonary disease, unspecified: Secondary | ICD-10-CM | POA: Diagnosis not present

## 2018-04-19 DIAGNOSIS — M79606 Pain in leg, unspecified: Secondary | ICD-10-CM | POA: Diagnosis not present

## 2018-04-19 DIAGNOSIS — M47816 Spondylosis without myelopathy or radiculopathy, lumbar region: Secondary | ICD-10-CM | POA: Diagnosis not present

## 2018-04-19 DIAGNOSIS — G894 Chronic pain syndrome: Secondary | ICD-10-CM | POA: Diagnosis not present

## 2018-04-19 DIAGNOSIS — Z79891 Long term (current) use of opiate analgesic: Secondary | ICD-10-CM | POA: Diagnosis not present

## 2018-04-19 DIAGNOSIS — Z79899 Other long term (current) drug therapy: Secondary | ICD-10-CM | POA: Diagnosis not present

## 2018-04-19 DIAGNOSIS — M533 Sacrococcygeal disorders, not elsewhere classified: Secondary | ICD-10-CM | POA: Diagnosis not present

## 2018-05-17 DIAGNOSIS — M533 Sacrococcygeal disorders, not elsewhere classified: Secondary | ICD-10-CM | POA: Diagnosis not present

## 2018-05-17 DIAGNOSIS — M47816 Spondylosis without myelopathy or radiculopathy, lumbar region: Secondary | ICD-10-CM | POA: Diagnosis not present

## 2018-05-17 DIAGNOSIS — M79606 Pain in leg, unspecified: Secondary | ICD-10-CM | POA: Diagnosis not present

## 2018-05-17 DIAGNOSIS — G894 Chronic pain syndrome: Secondary | ICD-10-CM | POA: Diagnosis not present

## 2018-05-29 DIAGNOSIS — I209 Angina pectoris, unspecified: Secondary | ICD-10-CM | POA: Diagnosis not present

## 2018-05-29 DIAGNOSIS — I10 Essential (primary) hypertension: Secondary | ICD-10-CM | POA: Diagnosis not present

## 2018-05-29 DIAGNOSIS — E782 Mixed hyperlipidemia: Secondary | ICD-10-CM | POA: Diagnosis not present

## 2018-05-29 DIAGNOSIS — J449 Chronic obstructive pulmonary disease, unspecified: Secondary | ICD-10-CM | POA: Diagnosis not present

## 2018-05-29 DIAGNOSIS — G4733 Obstructive sleep apnea (adult) (pediatric): Secondary | ICD-10-CM | POA: Diagnosis not present

## 2018-06-19 DIAGNOSIS — M79606 Pain in leg, unspecified: Secondary | ICD-10-CM | POA: Diagnosis not present

## 2018-06-19 DIAGNOSIS — M47816 Spondylosis without myelopathy or radiculopathy, lumbar region: Secondary | ICD-10-CM | POA: Diagnosis not present

## 2018-06-19 DIAGNOSIS — G894 Chronic pain syndrome: Secondary | ICD-10-CM | POA: Diagnosis not present

## 2018-06-19 DIAGNOSIS — M47817 Spondylosis without myelopathy or radiculopathy, lumbosacral region: Secondary | ICD-10-CM | POA: Diagnosis not present

## 2018-06-19 DIAGNOSIS — M533 Sacrococcygeal disorders, not elsewhere classified: Secondary | ICD-10-CM | POA: Diagnosis not present

## 2018-07-03 DIAGNOSIS — M47817 Spondylosis without myelopathy or radiculopathy, lumbosacral region: Secondary | ICD-10-CM | POA: Diagnosis not present

## 2018-07-11 DIAGNOSIS — R234 Changes in skin texture: Secondary | ICD-10-CM | POA: Diagnosis not present

## 2018-07-11 DIAGNOSIS — R238 Other skin changes: Secondary | ICD-10-CM | POA: Diagnosis not present

## 2018-07-12 DIAGNOSIS — Z0000 Encounter for general adult medical examination without abnormal findings: Secondary | ICD-10-CM | POA: Diagnosis not present

## 2018-07-17 DIAGNOSIS — M533 Sacrococcygeal disorders, not elsewhere classified: Secondary | ICD-10-CM | POA: Diagnosis not present

## 2018-07-17 DIAGNOSIS — M47816 Spondylosis without myelopathy or radiculopathy, lumbar region: Secondary | ICD-10-CM | POA: Diagnosis not present

## 2018-07-17 DIAGNOSIS — G894 Chronic pain syndrome: Secondary | ICD-10-CM | POA: Diagnosis not present

## 2018-07-17 DIAGNOSIS — Z79899 Other long term (current) drug therapy: Secondary | ICD-10-CM | POA: Diagnosis not present

## 2018-07-17 DIAGNOSIS — M79606 Pain in leg, unspecified: Secondary | ICD-10-CM | POA: Diagnosis not present

## 2018-07-17 DIAGNOSIS — Z79891 Long term (current) use of opiate analgesic: Secondary | ICD-10-CM | POA: Diagnosis not present

## 2018-07-27 DIAGNOSIS — D225 Melanocytic nevi of trunk: Secondary | ICD-10-CM | POA: Diagnosis not present

## 2018-07-27 DIAGNOSIS — L820 Inflamed seborrheic keratosis: Secondary | ICD-10-CM | POA: Diagnosis not present

## 2018-07-27 DIAGNOSIS — Z1283 Encounter for screening for malignant neoplasm of skin: Secondary | ICD-10-CM | POA: Diagnosis not present

## 2018-08-15 DIAGNOSIS — M533 Sacrococcygeal disorders, not elsewhere classified: Secondary | ICD-10-CM | POA: Diagnosis not present

## 2018-08-15 DIAGNOSIS — M47816 Spondylosis without myelopathy or radiculopathy, lumbar region: Secondary | ICD-10-CM | POA: Diagnosis not present

## 2018-08-15 DIAGNOSIS — G894 Chronic pain syndrome: Secondary | ICD-10-CM | POA: Diagnosis not present

## 2018-08-15 DIAGNOSIS — M79606 Pain in leg, unspecified: Secondary | ICD-10-CM | POA: Diagnosis not present

## 2018-08-16 DIAGNOSIS — E782 Mixed hyperlipidemia: Secondary | ICD-10-CM | POA: Diagnosis not present

## 2018-08-16 DIAGNOSIS — I10 Essential (primary) hypertension: Secondary | ICD-10-CM | POA: Diagnosis not present

## 2018-08-18 DIAGNOSIS — E782 Mixed hyperlipidemia: Secondary | ICD-10-CM | POA: Diagnosis not present

## 2018-08-18 DIAGNOSIS — I209 Angina pectoris, unspecified: Secondary | ICD-10-CM | POA: Diagnosis not present

## 2018-08-18 DIAGNOSIS — I10 Essential (primary) hypertension: Secondary | ICD-10-CM | POA: Diagnosis not present

## 2018-08-18 DIAGNOSIS — J449 Chronic obstructive pulmonary disease, unspecified: Secondary | ICD-10-CM | POA: Diagnosis not present

## 2018-08-24 DIAGNOSIS — I10 Essential (primary) hypertension: Secondary | ICD-10-CM | POA: Diagnosis not present

## 2018-08-24 DIAGNOSIS — E782 Mixed hyperlipidemia: Secondary | ICD-10-CM | POA: Diagnosis not present

## 2018-08-24 DIAGNOSIS — J449 Chronic obstructive pulmonary disease, unspecified: Secondary | ICD-10-CM | POA: Diagnosis not present

## 2018-08-24 DIAGNOSIS — I209 Angina pectoris, unspecified: Secondary | ICD-10-CM | POA: Diagnosis not present

## 2018-08-24 DIAGNOSIS — G4733 Obstructive sleep apnea (adult) (pediatric): Secondary | ICD-10-CM | POA: Diagnosis not present

## 2018-09-14 DIAGNOSIS — M533 Sacrococcygeal disorders, not elsewhere classified: Secondary | ICD-10-CM | POA: Diagnosis not present

## 2018-09-14 DIAGNOSIS — Z79899 Other long term (current) drug therapy: Secondary | ICD-10-CM | POA: Diagnosis not present

## 2018-09-14 DIAGNOSIS — Z79891 Long term (current) use of opiate analgesic: Secondary | ICD-10-CM | POA: Diagnosis not present

## 2018-09-14 DIAGNOSIS — M79606 Pain in leg, unspecified: Secondary | ICD-10-CM | POA: Diagnosis not present

## 2018-09-14 DIAGNOSIS — M47816 Spondylosis without myelopathy or radiculopathy, lumbar region: Secondary | ICD-10-CM | POA: Diagnosis not present

## 2018-09-14 DIAGNOSIS — G894 Chronic pain syndrome: Secondary | ICD-10-CM | POA: Diagnosis not present

## 2018-09-20 ENCOUNTER — Other Ambulatory Visit: Payer: Self-pay | Admitting: Cardiology

## 2018-09-20 IMAGING — MR MR THORACIC SPINE W/O CM
4 of 6 series · 12 of 48 positions shown · non-contrast
Comparison: Thoracic spine radiographs 01/24/2015. Lumbar MRI
07/13/2011.

CLINICAL DATA: 56-year-old female with chronic thoracolumbar back
pain. Prior spinal injections with only short-term relief.
Intrascapular pain.

EXAM:
MRI THORACIC SPINE WITHOUT CONTRAST
TECHNIQUE: Multiplanar, multisequence MR imaging of the thoracic spine was
performed. No intravenous contrast was administered.

[Series 4: T2 · sagittal · 4.0mm · 0.33mm/px · 3 of 12 slices shown (1 of 3)]
[im 3/12]
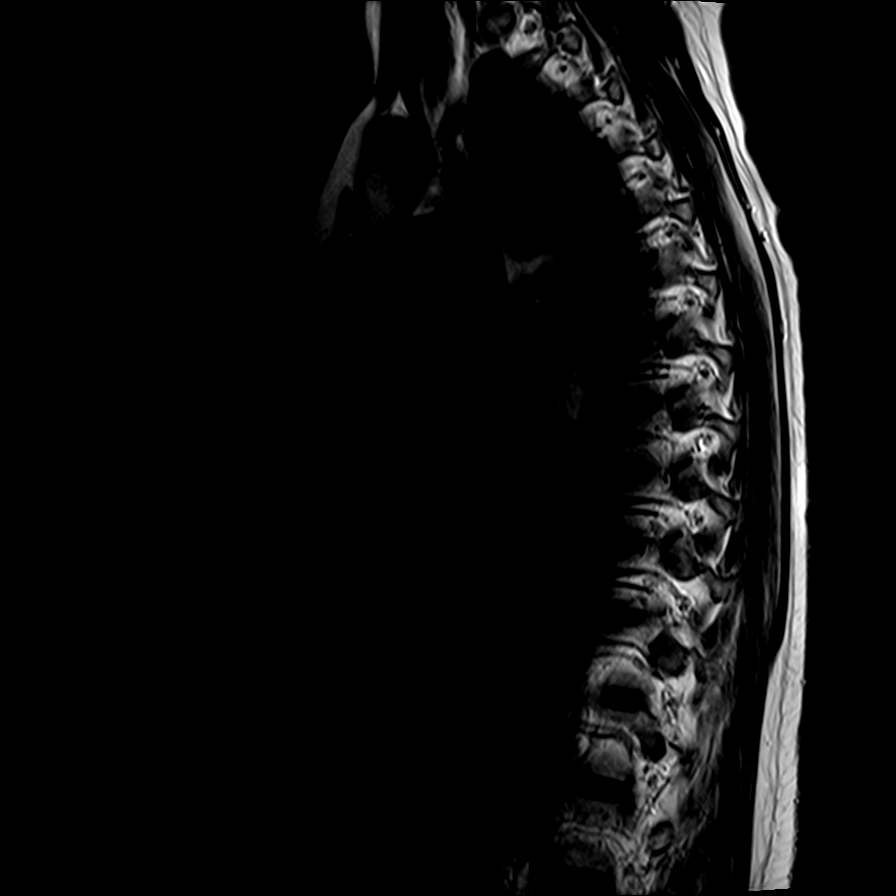
[im 7/12]
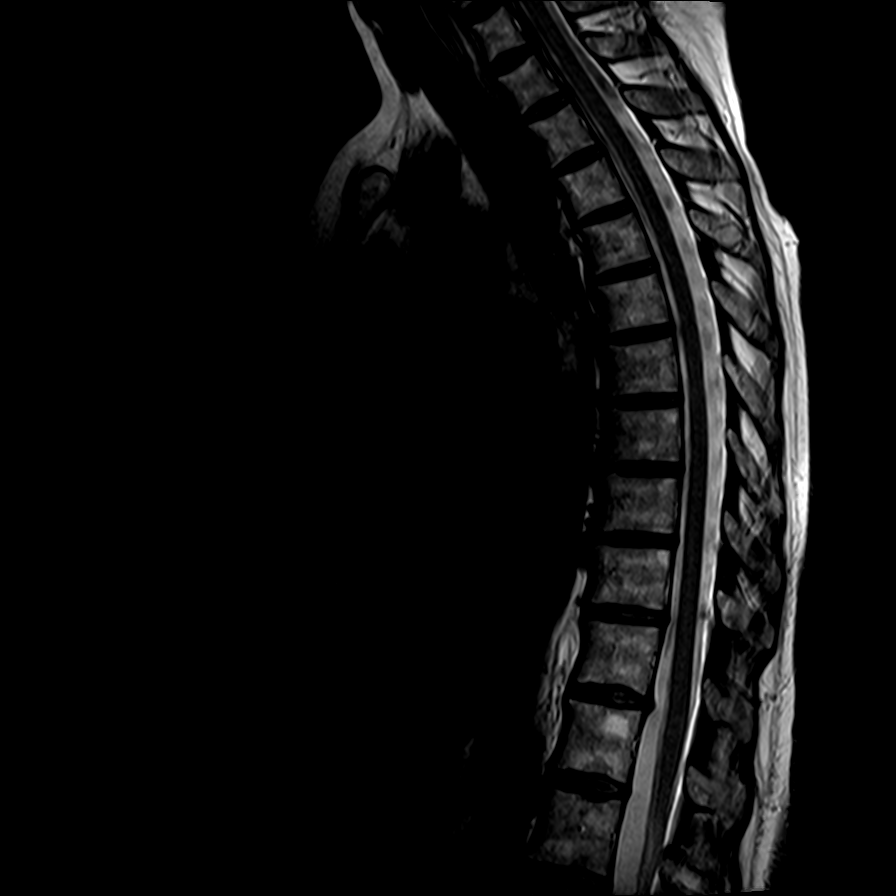
[im 12/12]
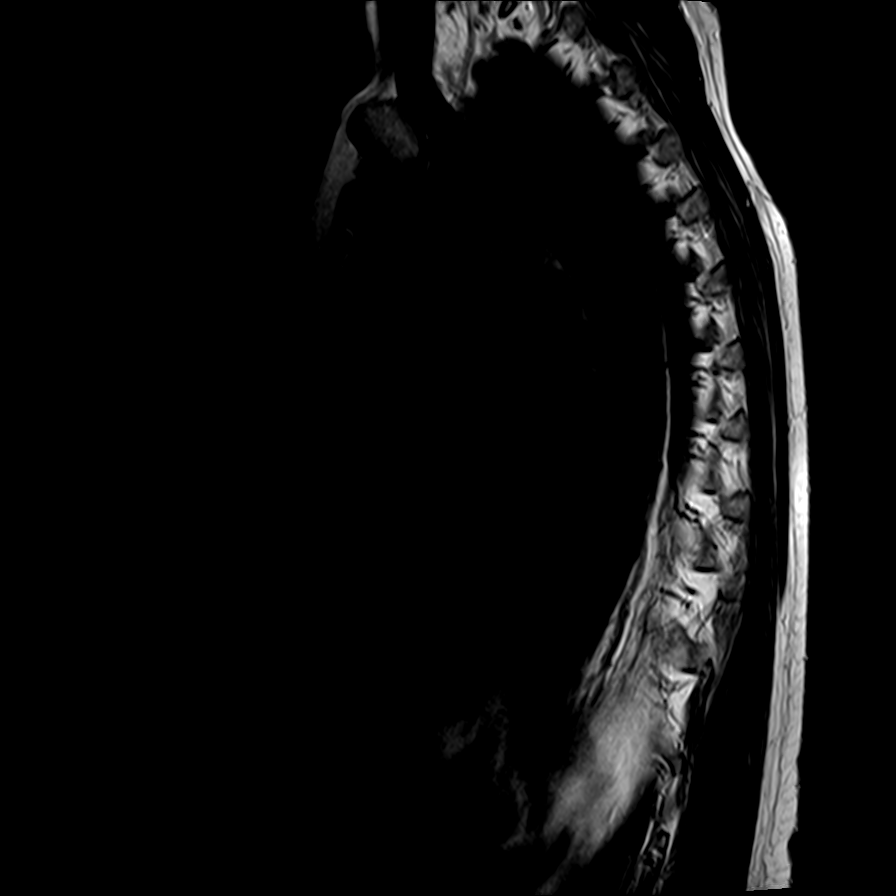

[Series 5: T1 · sagittal · 4.0mm · 0.67mm/px · 3 of 12 slices shown]
[im 1/12]
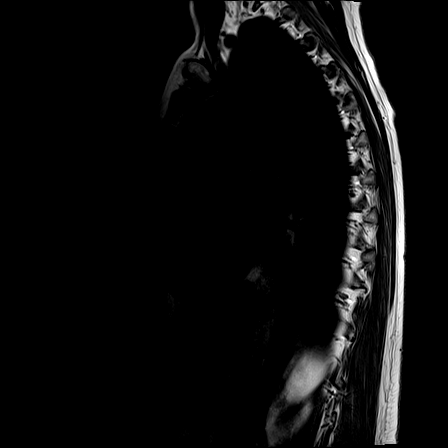
[im 6/12]
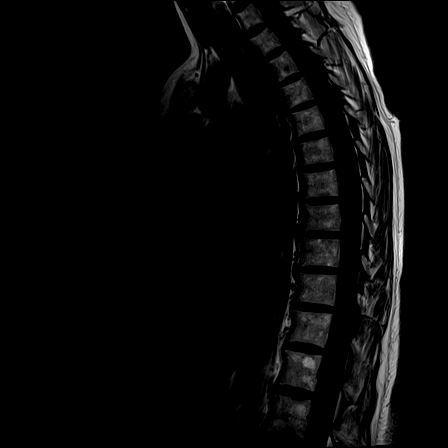
[im 12/12]
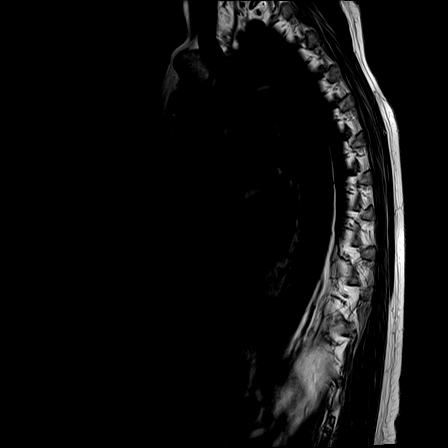

[Series 7: T2 · axial · 4.0mm · 0.39mm/px · z∈[-223,-90]mm · 3 of 32 slices shown (2 of 3)]
[im 5/32]
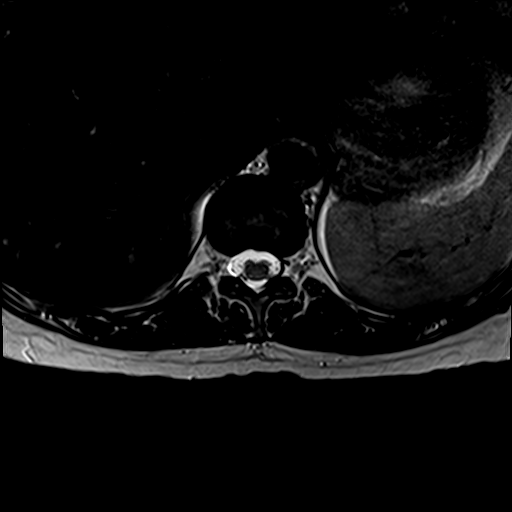
[im 17/32]
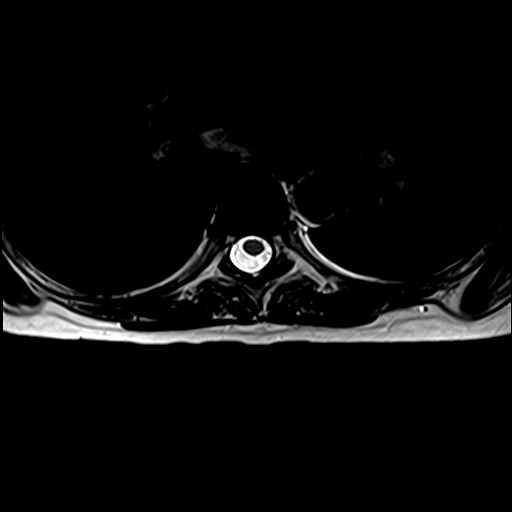
[im 27/32]
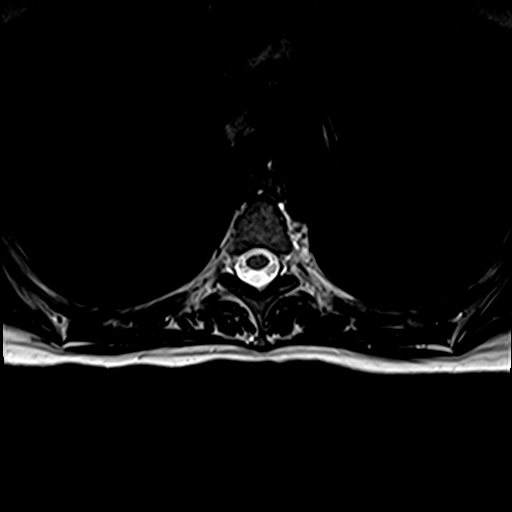

[Series 8: T2 · axial · 4.0mm · 0.39mm/px · z∈[-225,-88]mm · 3 of 32 slices shown (3 of 3)]
[im 5/32]
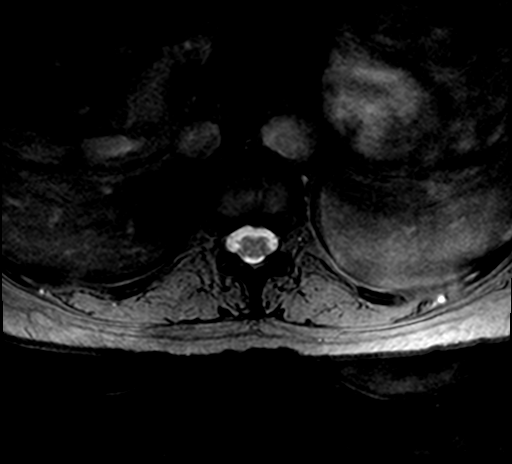
[im 17/32]
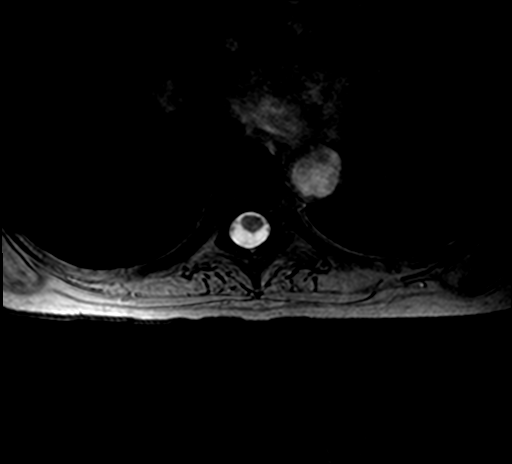
[im 27/32]
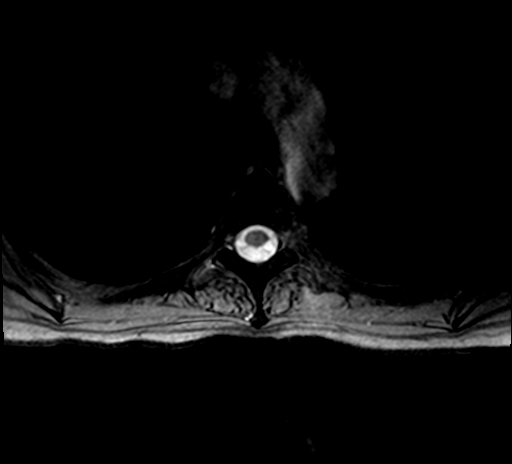

[12 of 48 positions shown; findings below may reference images not displayed]

FINDINGS: Limited cervical spine imaging: C4-C5 through C6-C7 chronic disc and
endplate degeneration. Mild associated degenerative cervical spinal
stenosis suspected (series 2, image 3).

Thoracic spine segmentation: Appears normal, and this is concordant
with the 6899 lumbar spine numbering.

Alignment: Stable since 3415 with normal thoracic kyphosis. No
spondylolisthesis.

Vertebrae: Visualized bone marrow signal is within normal limits. No
marrow edema or evidence of acute osseous abnormality.

Cord: Spinal cord signal is within normal limits at all visualized
levels. Capacious thoracic spinal canal. The conus medullaris
appears stable and normal at T12-L1

Paraspinal and other soft tissues: Negative.

Disc levels:

T1-T2: Mild left facet hypertrophy.  No stenosis.

T2-T3: Negative.

T3-T4: Negative.

T4-T5: Negative.

T5-T6: Borderline to mild facet hypertrophy.  No stenosis.

T6-T7: Negative.

T7-T8: Borderline to mild left facet hypertrophy.  No stenosis.

T8-T9: Small left subarticular disc protrusion best seen on series
4, image 8. No stenosis.

T9-T10: Mild to moderate facet hypertrophy. Borderline to mild right
T9 foraminal stenosis.

T10-T11: Subtle left subarticular disc protrusion on series 7, image
25. Mild facet hypertrophy. No stenosis.

T11-T12: Small central to left paracentral disc protrusion on series
7, image 28. No stenosis.

T12-L1: Negative.  Negative visible L1-L2 level.
IMPRESSION: 1.  No acute osseous abnormality in the thoracic spine.
2. Capacious thoracic spinal canal and fairly mild for age thoracic
spine degeneration. No spinal stenosis. There is up to mild neural
foraminal stenosis at the right T9 nerve level related to facet
hypertrophy.
3. Partially visible cervical spine degeneration appears more
advanced with probable mild degenerative cervical spinal stenosis.

## 2018-09-21 DIAGNOSIS — E782 Mixed hyperlipidemia: Secondary | ICD-10-CM | POA: Diagnosis not present

## 2018-09-21 DIAGNOSIS — I10 Essential (primary) hypertension: Secondary | ICD-10-CM | POA: Diagnosis not present

## 2018-09-21 DIAGNOSIS — G4733 Obstructive sleep apnea (adult) (pediatric): Secondary | ICD-10-CM | POA: Diagnosis not present

## 2018-09-21 DIAGNOSIS — J449 Chronic obstructive pulmonary disease, unspecified: Secondary | ICD-10-CM | POA: Diagnosis not present

## 2018-10-23 DIAGNOSIS — G894 Chronic pain syndrome: Secondary | ICD-10-CM | POA: Diagnosis not present

## 2018-10-23 DIAGNOSIS — M47817 Spondylosis without myelopathy or radiculopathy, lumbosacral region: Secondary | ICD-10-CM | POA: Diagnosis not present

## 2018-10-23 DIAGNOSIS — M5136 Other intervertebral disc degeneration, lumbar region: Secondary | ICD-10-CM | POA: Diagnosis not present

## 2018-10-23 DIAGNOSIS — M545 Low back pain: Secondary | ICD-10-CM | POA: Diagnosis not present

## 2018-11-03 DIAGNOSIS — G4733 Obstructive sleep apnea (adult) (pediatric): Secondary | ICD-10-CM | POA: Diagnosis not present

## 2018-11-03 DIAGNOSIS — E782 Mixed hyperlipidemia: Secondary | ICD-10-CM | POA: Diagnosis not present

## 2018-11-03 DIAGNOSIS — I10 Essential (primary) hypertension: Secondary | ICD-10-CM | POA: Diagnosis not present

## 2018-11-03 DIAGNOSIS — J449 Chronic obstructive pulmonary disease, unspecified: Secondary | ICD-10-CM | POA: Diagnosis not present

## 2018-11-14 DIAGNOSIS — M5137 Other intervertebral disc degeneration, lumbosacral region: Secondary | ICD-10-CM | POA: Diagnosis not present

## 2018-11-14 DIAGNOSIS — Z79899 Other long term (current) drug therapy: Secondary | ICD-10-CM | POA: Diagnosis not present

## 2018-11-14 DIAGNOSIS — Z79891 Long term (current) use of opiate analgesic: Secondary | ICD-10-CM | POA: Diagnosis not present

## 2018-11-14 DIAGNOSIS — M79606 Pain in leg, unspecified: Secondary | ICD-10-CM | POA: Diagnosis not present

## 2018-11-14 DIAGNOSIS — M533 Sacrococcygeal disorders, not elsewhere classified: Secondary | ICD-10-CM | POA: Diagnosis not present

## 2018-11-14 DIAGNOSIS — G894 Chronic pain syndrome: Secondary | ICD-10-CM | POA: Diagnosis not present

## 2018-11-16 DIAGNOSIS — I10 Essential (primary) hypertension: Secondary | ICD-10-CM | POA: Diagnosis not present

## 2018-11-16 DIAGNOSIS — E782 Mixed hyperlipidemia: Secondary | ICD-10-CM | POA: Diagnosis not present

## 2018-11-17 DIAGNOSIS — G4733 Obstructive sleep apnea (adult) (pediatric): Secondary | ICD-10-CM | POA: Diagnosis not present

## 2018-11-17 DIAGNOSIS — E782 Mixed hyperlipidemia: Secondary | ICD-10-CM | POA: Diagnosis not present

## 2018-11-17 DIAGNOSIS — J449 Chronic obstructive pulmonary disease, unspecified: Secondary | ICD-10-CM | POA: Diagnosis not present

## 2018-11-17 DIAGNOSIS — I10 Essential (primary) hypertension: Secondary | ICD-10-CM | POA: Diagnosis not present

## 2018-12-01 DIAGNOSIS — I209 Angina pectoris, unspecified: Secondary | ICD-10-CM | POA: Diagnosis not present

## 2018-12-01 DIAGNOSIS — I10 Essential (primary) hypertension: Secondary | ICD-10-CM | POA: Diagnosis not present

## 2018-12-01 DIAGNOSIS — E782 Mixed hyperlipidemia: Secondary | ICD-10-CM | POA: Diagnosis not present

## 2018-12-01 DIAGNOSIS — J449 Chronic obstructive pulmonary disease, unspecified: Secondary | ICD-10-CM | POA: Diagnosis not present

## 2018-12-12 DIAGNOSIS — M533 Sacrococcygeal disorders, not elsewhere classified: Secondary | ICD-10-CM | POA: Diagnosis not present

## 2018-12-12 DIAGNOSIS — M5137 Other intervertebral disc degeneration, lumbosacral region: Secondary | ICD-10-CM | POA: Diagnosis not present

## 2018-12-12 DIAGNOSIS — M47816 Spondylosis without myelopathy or radiculopathy, lumbar region: Secondary | ICD-10-CM | POA: Diagnosis not present

## 2018-12-12 DIAGNOSIS — G894 Chronic pain syndrome: Secondary | ICD-10-CM | POA: Diagnosis not present

## 2018-12-13 ENCOUNTER — Ambulatory Visit (HOSPITAL_COMMUNITY)
Admission: RE | Admit: 2018-12-13 | Discharge: 2018-12-13 | Disposition: A | Discharge status: Home / Self Care | Payer: Medicare Other | Source: Ambulatory Visit | Attending: Adult Health Nurse Practitioner | Admitting: Adult Health Nurse Practitioner

## 2018-12-13 ENCOUNTER — Other Ambulatory Visit: Payer: Self-pay

## 2018-12-13 ENCOUNTER — Other Ambulatory Visit (HOSPITAL_COMMUNITY): Payer: Self-pay | Admitting: Adult Health Nurse Practitioner

## 2018-12-13 DIAGNOSIS — Z87891 Personal history of nicotine dependence: Secondary | ICD-10-CM | POA: Diagnosis not present

## 2018-12-13 DIAGNOSIS — R05 Cough: Secondary | ICD-10-CM | POA: Diagnosis not present

## 2018-12-13 DIAGNOSIS — R059 Cough, unspecified: Secondary | ICD-10-CM

## 2018-12-26 ENCOUNTER — Other Ambulatory Visit: Payer: Self-pay

## 2018-12-26 NOTE — Patient Outreach (Signed)
[place] [facility]) Care Management  12/26/2018  Heidi Mills. Comes [birth year] [id number]   Medication Adherence call to Mrs. Heidi Mills spoke with patient she is past due on Atorvastatin 10 mg patient explain she takes 1 tablet daily,patient ask to call [place] to order this medication because they are waiting for doctor to approve a refill. [facility] pharmacy has a prescription ready for patient to pick up. Heidi Mills is showing past due under [place].   [place] Management Direct Dial [phone]  Fax [phone] [email or web address]

## 2019-01-03 DIAGNOSIS — M47817 Spondylosis without myelopathy or radiculopathy, lumbosacral region: Secondary | ICD-10-CM | POA: Diagnosis not present

## 2019-01-09 DIAGNOSIS — E782 Mixed hyperlipidemia: Secondary | ICD-10-CM | POA: Diagnosis not present

## 2019-01-09 DIAGNOSIS — J449 Chronic obstructive pulmonary disease, unspecified: Secondary | ICD-10-CM | POA: Diagnosis not present

## 2019-01-09 DIAGNOSIS — G4733 Obstructive sleep apnea (adult) (pediatric): Secondary | ICD-10-CM | POA: Diagnosis not present

## 2019-01-09 DIAGNOSIS — I10 Essential (primary) hypertension: Secondary | ICD-10-CM | POA: Diagnosis not present

## 2019-01-17 DIAGNOSIS — M47817 Spondylosis without myelopathy or radiculopathy, lumbosacral region: Secondary | ICD-10-CM | POA: Diagnosis not present

## 2019-02-06 DIAGNOSIS — M47816 Spondylosis without myelopathy or radiculopathy, lumbar region: Secondary | ICD-10-CM | POA: Diagnosis not present

## 2019-02-06 DIAGNOSIS — M533 Sacrococcygeal disorders, not elsewhere classified: Secondary | ICD-10-CM | POA: Diagnosis not present

## 2019-02-06 DIAGNOSIS — M79606 Pain in leg, unspecified: Secondary | ICD-10-CM | POA: Diagnosis not present

## 2019-02-06 DIAGNOSIS — G894 Chronic pain syndrome: Secondary | ICD-10-CM | POA: Diagnosis not present

## 2019-02-06 DIAGNOSIS — Z79899 Other long term (current) drug therapy: Secondary | ICD-10-CM | POA: Diagnosis not present

## 2019-02-06 DIAGNOSIS — Z79891 Long term (current) use of opiate analgesic: Secondary | ICD-10-CM | POA: Diagnosis not present

## 2019-02-13 DIAGNOSIS — I10 Essential (primary) hypertension: Secondary | ICD-10-CM | POA: Diagnosis not present

## 2019-02-13 DIAGNOSIS — E782 Mixed hyperlipidemia: Secondary | ICD-10-CM | POA: Diagnosis not present

## 2019-02-13 DIAGNOSIS — J449 Chronic obstructive pulmonary disease, unspecified: Secondary | ICD-10-CM | POA: Diagnosis not present

## 2019-02-15 ENCOUNTER — Other Ambulatory Visit: Payer: Self-pay | Admitting: Cardiology

## 2019-03-06 DIAGNOSIS — M533 Sacrococcygeal disorders, not elsewhere classified: Secondary | ICD-10-CM | POA: Diagnosis not present

## 2019-03-06 DIAGNOSIS — G894 Chronic pain syndrome: Secondary | ICD-10-CM | POA: Diagnosis not present

## 2019-03-06 DIAGNOSIS — M47816 Spondylosis without myelopathy or radiculopathy, lumbar region: Secondary | ICD-10-CM | POA: Diagnosis not present

## 2019-03-06 DIAGNOSIS — M79606 Pain in leg, unspecified: Secondary | ICD-10-CM | POA: Diagnosis not present

## 2019-03-19 DIAGNOSIS — J449 Chronic obstructive pulmonary disease, unspecified: Secondary | ICD-10-CM | POA: Diagnosis not present

## 2019-03-19 DIAGNOSIS — E7849 Other hyperlipidemia: Secondary | ICD-10-CM | POA: Diagnosis not present

## 2019-03-19 DIAGNOSIS — I10 Essential (primary) hypertension: Secondary | ICD-10-CM | POA: Diagnosis not present

## 2019-04-05 DIAGNOSIS — Z79899 Other long term (current) drug therapy: Secondary | ICD-10-CM | POA: Diagnosis not present

## 2019-04-05 DIAGNOSIS — M47816 Spondylosis without myelopathy or radiculopathy, lumbar region: Secondary | ICD-10-CM | POA: Diagnosis not present

## 2019-04-05 DIAGNOSIS — M5137 Other intervertebral disc degeneration, lumbosacral region: Secondary | ICD-10-CM | POA: Diagnosis not present

## 2019-04-05 DIAGNOSIS — Z79891 Long term (current) use of opiate analgesic: Secondary | ICD-10-CM | POA: Diagnosis not present

## 2019-04-05 DIAGNOSIS — G894 Chronic pain syndrome: Secondary | ICD-10-CM | POA: Diagnosis not present

## 2019-04-05 DIAGNOSIS — M533 Sacrococcygeal disorders, not elsewhere classified: Secondary | ICD-10-CM | POA: Diagnosis not present

## 2019-04-11 DIAGNOSIS — E782 Mixed hyperlipidemia: Secondary | ICD-10-CM | POA: Diagnosis not present

## 2019-04-11 DIAGNOSIS — I10 Essential (primary) hypertension: Secondary | ICD-10-CM | POA: Diagnosis not present

## 2019-04-12 DIAGNOSIS — Z720 Tobacco use: Secondary | ICD-10-CM | POA: Diagnosis not present

## 2019-04-12 DIAGNOSIS — I10 Essential (primary) hypertension: Secondary | ICD-10-CM | POA: Diagnosis not present

## 2019-04-12 DIAGNOSIS — J441 Chronic obstructive pulmonary disease with (acute) exacerbation: Secondary | ICD-10-CM | POA: Diagnosis not present

## 2019-04-12 DIAGNOSIS — D72829 Elevated white blood cell count, unspecified: Secondary | ICD-10-CM | POA: Diagnosis not present

## 2019-04-12 DIAGNOSIS — E782 Mixed hyperlipidemia: Secondary | ICD-10-CM | POA: Diagnosis not present

## 2019-04-30 DIAGNOSIS — E7849 Other hyperlipidemia: Secondary | ICD-10-CM | POA: Diagnosis not present

## 2019-04-30 DIAGNOSIS — I10 Essential (primary) hypertension: Secondary | ICD-10-CM | POA: Diagnosis not present

## 2019-04-30 DIAGNOSIS — J449 Chronic obstructive pulmonary disease, unspecified: Secondary | ICD-10-CM | POA: Diagnosis not present

## 2019-04-30 DIAGNOSIS — G4733 Obstructive sleep apnea (adult) (pediatric): Secondary | ICD-10-CM | POA: Diagnosis not present

## 2019-05-02 ENCOUNTER — Other Ambulatory Visit: Payer: Self-pay

## 2019-05-02 NOTE — Patient Outreach (Signed)
[place] [facility]) Care Management  05/02/2019  [name]. [place] 1960-08-08 [id number]   Medication Adherence call to Mrs. [place] Voice message left with a call back number. Mrs. Viverette is showing past due on Atorvastatin 10 mg under [place].   [place] Management Direct Dial [phone]  Fax [phone] [email or web address]

## 2019-05-03 DIAGNOSIS — M79606 Pain in leg, unspecified: Secondary | ICD-10-CM | POA: Diagnosis not present

## 2019-05-03 DIAGNOSIS — M5137 Other intervertebral disc degeneration, lumbosacral region: Secondary | ICD-10-CM | POA: Diagnosis not present

## 2019-05-03 DIAGNOSIS — M533 Sacrococcygeal disorders, not elsewhere classified: Secondary | ICD-10-CM | POA: Diagnosis not present

## 2019-05-03 DIAGNOSIS — G894 Chronic pain syndrome: Secondary | ICD-10-CM | POA: Diagnosis not present

## 2019-05-10 DIAGNOSIS — I10 Essential (primary) hypertension: Secondary | ICD-10-CM | POA: Diagnosis not present

## 2019-05-10 DIAGNOSIS — E7849 Other hyperlipidemia: Secondary | ICD-10-CM | POA: Diagnosis not present

## 2019-05-10 DIAGNOSIS — J449 Chronic obstructive pulmonary disease, unspecified: Secondary | ICD-10-CM | POA: Diagnosis not present

## 2019-05-31 DIAGNOSIS — M533 Sacrococcygeal disorders, not elsewhere classified: Secondary | ICD-10-CM | POA: Diagnosis not present

## 2019-05-31 DIAGNOSIS — M5137 Other intervertebral disc degeneration, lumbosacral region: Secondary | ICD-10-CM | POA: Diagnosis not present

## 2019-05-31 DIAGNOSIS — Z79891 Long term (current) use of opiate analgesic: Secondary | ICD-10-CM | POA: Diagnosis not present

## 2019-05-31 DIAGNOSIS — M47816 Spondylosis without myelopathy or radiculopathy, lumbar region: Secondary | ICD-10-CM | POA: Diagnosis not present

## 2019-05-31 DIAGNOSIS — Z79899 Other long term (current) drug therapy: Secondary | ICD-10-CM | POA: Diagnosis not present

## 2019-05-31 DIAGNOSIS — G894 Chronic pain syndrome: Secondary | ICD-10-CM | POA: Diagnosis not present

## 2019-06-01 ENCOUNTER — Other Ambulatory Visit: Payer: Self-pay | Admitting: Cardiology

## 2019-06-18 DIAGNOSIS — J449 Chronic obstructive pulmonary disease, unspecified: Secondary | ICD-10-CM | POA: Diagnosis not present

## 2019-06-18 DIAGNOSIS — E7849 Other hyperlipidemia: Secondary | ICD-10-CM | POA: Diagnosis not present

## 2019-06-18 DIAGNOSIS — I10 Essential (primary) hypertension: Secondary | ICD-10-CM | POA: Diagnosis not present

## 2019-07-05 DIAGNOSIS — M533 Sacrococcygeal disorders, not elsewhere classified: Secondary | ICD-10-CM | POA: Diagnosis not present

## 2019-07-05 DIAGNOSIS — M5137 Other intervertebral disc degeneration, lumbosacral region: Secondary | ICD-10-CM | POA: Diagnosis not present

## 2019-07-05 DIAGNOSIS — G894 Chronic pain syndrome: Secondary | ICD-10-CM | POA: Diagnosis not present

## 2019-07-05 DIAGNOSIS — M47816 Spondylosis without myelopathy or radiculopathy, lumbar region: Secondary | ICD-10-CM | POA: Diagnosis not present

## 2019-07-24 DIAGNOSIS — E782 Mixed hyperlipidemia: Secondary | ICD-10-CM | POA: Diagnosis not present

## 2019-07-24 DIAGNOSIS — Z720 Tobacco use: Secondary | ICD-10-CM | POA: Diagnosis not present

## 2019-07-24 DIAGNOSIS — I10 Essential (primary) hypertension: Secondary | ICD-10-CM | POA: Diagnosis not present

## 2019-07-24 DIAGNOSIS — D72829 Elevated white blood cell count, unspecified: Secondary | ICD-10-CM | POA: Diagnosis not present

## 2019-07-24 DIAGNOSIS — J441 Chronic obstructive pulmonary disease with (acute) exacerbation: Secondary | ICD-10-CM | POA: Diagnosis not present

## 2019-07-27 DIAGNOSIS — J441 Chronic obstructive pulmonary disease with (acute) exacerbation: Secondary | ICD-10-CM | POA: Diagnosis not present

## 2019-07-27 DIAGNOSIS — D72829 Elevated white blood cell count, unspecified: Secondary | ICD-10-CM | POA: Diagnosis not present

## 2019-07-27 DIAGNOSIS — E782 Mixed hyperlipidemia: Secondary | ICD-10-CM | POA: Diagnosis not present

## 2019-07-27 DIAGNOSIS — Z720 Tobacco use: Secondary | ICD-10-CM | POA: Diagnosis not present

## 2019-07-27 DIAGNOSIS — I10 Essential (primary) hypertension: Secondary | ICD-10-CM | POA: Diagnosis not present

## 2019-07-31 ENCOUNTER — Other Ambulatory Visit (HOSPITAL_COMMUNITY): Payer: Self-pay | Admitting: Respiratory Therapy

## 2019-07-31 DIAGNOSIS — R0602 Shortness of breath: Secondary | ICD-10-CM

## 2019-07-31 DIAGNOSIS — J441 Chronic obstructive pulmonary disease with (acute) exacerbation: Secondary | ICD-10-CM

## 2019-08-02 DIAGNOSIS — M79606 Pain in leg, unspecified: Secondary | ICD-10-CM | POA: Diagnosis not present

## 2019-08-02 DIAGNOSIS — G894 Chronic pain syndrome: Secondary | ICD-10-CM | POA: Diagnosis not present

## 2019-08-02 DIAGNOSIS — M533 Sacrococcygeal disorders, not elsewhere classified: Secondary | ICD-10-CM | POA: Diagnosis not present

## 2019-08-02 DIAGNOSIS — M5137 Other intervertebral disc degeneration, lumbosacral region: Secondary | ICD-10-CM | POA: Diagnosis not present

## 2019-08-07 DIAGNOSIS — Z1211 Encounter for screening for malignant neoplasm of colon: Secondary | ICD-10-CM | POA: Diagnosis not present

## 2019-08-10 LAB — COLOGUARD: COLOGUARD: NEGATIVE

## 2019-08-10 LAB — EXTERNAL GENERIC LAB PROCEDURE: COLOGUARD: NEGATIVE

## 2019-08-14 ENCOUNTER — Other Ambulatory Visit (HOSPITAL_COMMUNITY): Payer: Self-pay | Admitting: Internal Medicine

## 2019-08-14 ENCOUNTER — Other Ambulatory Visit: Payer: Self-pay

## 2019-08-14 ENCOUNTER — Ambulatory Visit (HOSPITAL_COMMUNITY)
Admission: RE | Admit: 2019-08-14 | Discharge: 2019-08-14 | Disposition: A | Discharge status: Home / Self Care | Payer: Medicare Other | Source: Ambulatory Visit | Attending: Internal Medicine | Admitting: Internal Medicine

## 2019-08-14 DIAGNOSIS — M25572 Pain in left ankle and joints of left foot: Secondary | ICD-10-CM

## 2019-08-14 DIAGNOSIS — T1490XA Injury, unspecified, initial encounter: Secondary | ICD-10-CM | POA: Insufficient documentation

## 2019-08-14 DIAGNOSIS — M79672 Pain in left foot: Secondary | ICD-10-CM

## 2019-08-14 DIAGNOSIS — S99912A Unspecified injury of left ankle, initial encounter: Secondary | ICD-10-CM | POA: Diagnosis not present

## 2019-08-14 DIAGNOSIS — S99922A Unspecified injury of left foot, initial encounter: Secondary | ICD-10-CM | POA: Diagnosis not present

## 2019-08-15 DIAGNOSIS — E782 Mixed hyperlipidemia: Secondary | ICD-10-CM | POA: Diagnosis not present

## 2019-08-15 DIAGNOSIS — J441 Chronic obstructive pulmonary disease with (acute) exacerbation: Secondary | ICD-10-CM | POA: Diagnosis not present

## 2019-08-15 DIAGNOSIS — I10 Essential (primary) hypertension: Secondary | ICD-10-CM | POA: Diagnosis not present

## 2019-08-15 DIAGNOSIS — D72829 Elevated white blood cell count, unspecified: Secondary | ICD-10-CM | POA: Diagnosis not present

## 2019-08-15 DIAGNOSIS — Z720 Tobacco use: Secondary | ICD-10-CM | POA: Diagnosis not present

## 2019-08-17 DIAGNOSIS — M25572 Pain in left ankle and joints of left foot: Secondary | ICD-10-CM | POA: Diagnosis not present

## 2019-08-20 DIAGNOSIS — Z79899 Other long term (current) drug therapy: Secondary | ICD-10-CM | POA: Diagnosis not present

## 2019-08-20 DIAGNOSIS — Z79891 Long term (current) use of opiate analgesic: Secondary | ICD-10-CM | POA: Diagnosis not present

## 2019-08-20 DIAGNOSIS — G894 Chronic pain syndrome: Secondary | ICD-10-CM | POA: Diagnosis not present

## 2019-08-20 DIAGNOSIS — M47817 Spondylosis without myelopathy or radiculopathy, lumbosacral region: Secondary | ICD-10-CM | POA: Diagnosis not present

## 2019-08-23 ENCOUNTER — Other Ambulatory Visit (HOSPITAL_COMMUNITY): Payer: Self-pay | Admitting: Internal Medicine

## 2019-08-23 DIAGNOSIS — S92901A Unspecified fracture of right foot, initial encounter for closed fracture: Secondary | ICD-10-CM

## 2019-08-30 ENCOUNTER — Other Ambulatory Visit: Payer: Self-pay | Admitting: Cardiology

## 2019-09-03 DIAGNOSIS — M47817 Spondylosis without myelopathy or radiculopathy, lumbosacral region: Secondary | ICD-10-CM | POA: Diagnosis not present

## 2019-09-07 ENCOUNTER — Other Ambulatory Visit (HOSPITAL_COMMUNITY)
Admission: RE | Admit: 2019-09-07 | Discharge: 2019-09-07 | Disposition: A | Discharge status: Home / Self Care | Payer: Medicare Other | Source: Ambulatory Visit | Attending: Internal Medicine | Admitting: Internal Medicine

## 2019-09-11 ENCOUNTER — Ambulatory Visit (HOSPITAL_COMMUNITY): Admission: RE | Admit: 2019-09-11 | Payer: Medicare Other | Source: Ambulatory Visit

## 2019-09-11 ENCOUNTER — Encounter (HOSPITAL_COMMUNITY): Payer: Medicare Other

## 2019-09-19 DIAGNOSIS — E782 Mixed hyperlipidemia: Secondary | ICD-10-CM | POA: Diagnosis not present

## 2019-09-19 DIAGNOSIS — Z720 Tobacco use: Secondary | ICD-10-CM | POA: Diagnosis not present

## 2019-09-19 DIAGNOSIS — J441 Chronic obstructive pulmonary disease with (acute) exacerbation: Secondary | ICD-10-CM | POA: Diagnosis not present

## 2019-09-19 DIAGNOSIS — I10 Essential (primary) hypertension: Secondary | ICD-10-CM | POA: Diagnosis not present

## 2019-09-19 DIAGNOSIS — D72829 Elevated white blood cell count, unspecified: Secondary | ICD-10-CM | POA: Diagnosis not present

## 2019-09-20 ENCOUNTER — Other Ambulatory Visit: Payer: Self-pay | Admitting: Cardiology

## 2019-10-01 ENCOUNTER — Other Ambulatory Visit: Payer: Self-pay | Admitting: Cardiology

## 2019-10-03 ENCOUNTER — Encounter: Payer: Self-pay | Admitting: *Deleted

## 2019-10-03 ENCOUNTER — Other Ambulatory Visit: Payer: Self-pay

## 2019-10-03 ENCOUNTER — Ambulatory Visit: Payer: Medicare Other | Admitting: Student

## 2019-10-03 ENCOUNTER — Encounter: Payer: Self-pay | Admitting: Student

## 2019-10-03 VITALS — BP 126/62 | HR 74 | Ht 62.0 in | Wt 111.2 lb

## 2019-10-03 DIAGNOSIS — E782 Mixed hyperlipidemia: Secondary | ICD-10-CM

## 2019-10-03 DIAGNOSIS — Z720 Tobacco use: Secondary | ICD-10-CM

## 2019-10-03 DIAGNOSIS — R079 Chest pain, unspecified: Secondary | ICD-10-CM | POA: Diagnosis not present

## 2019-10-03 DIAGNOSIS — I10 Essential (primary) hypertension: Secondary | ICD-10-CM

## 2019-10-03 MED ORDER — ISOSORBIDE DINITRATE 10 MG PO TABS: 10.0000 mg | ORAL_TABLET | Freq: Three times a day (TID) | ORAL | 3 refills | Status: AC

## 2019-10-03 NOTE — Progress Notes (Signed)
Cardiology Office Note    Date:  10/03/2019   ID:  Heidi Mills, DOB Nov 24, 1960, MRN [id number]  PCP:  Celene Squibb, MD  Cardiologist: Rozann Lesches, MD    Chief Complaint  Patient presents with  . Follow-up    Overdue visit; Recent chest pain    History of Present Illness:    Heidi Mills is a 59 y.o. female with past medical history of HTN, HLD, COPD and palpitations (Holter monitor in 2017 showing rare PAC's and PVC's) who presents to the office today for overdue follow-up.   She was last examined by Dr. Domenic Polite in 02/2018 and denied any recent chest pain but did have chronic dyspnea on exertion in the setting of her COPD. She did report lower extremity pain and lower extremity ABI's were obtained and showed no significant arterial occlusive disease.   In talking with the patient today, she reports having episodes of chest discomfort which can occur sporadically. She typically notices this if she misses a dose of Isordil and says her pain will resolve within a few minutes of taking the medication. She describes the pain as a discomfort along her sternal region and this can occur at rest or with activity. No association with food consumption or positional changes. She denies any recent orthopnea, PND, lower extremity edema or palpitations. She does have baseline dyspnea on exertion due to COPD.  Continues to smoke less than 1 ppd. Nicotine patches previously not covered by her insurance and she was unsuccessful with Chantix in the past.   Past Medical History:  Diagnosis Date  . Anxiety   . Chronic pain syndrome   . COPD (chronic obstructive pulmonary disease) ([place])   . Depression   . Essential hypertension   . Oxygen deficiency   . Sleep apnea     Past Surgical History:  Procedure Laterality Date  . CHOLECYSTECTOMY      Current Medications: Outpatient Medications Prior to Visit  Medication Sig Dispense Refill  . albuterol (PROAIR HFA) 108 (90 Base) MCG/ACT  inhaler Inhale 2 puffs into the lungs every 4 (four) hours as needed for wheezing or shortness of breath.    . ALPRAZolam (XANAX) 1 MG tablet Take 1 mg by mouth 3 (three) times daily as needed for anxiety.    Marland Kitchen amLODipine (NORVASC) 10 MG tablet TAKE 1 TABLET BY MOUTH EVERY DAY 90 tablet 0  . aspirin EC 81 MG tablet Take 81 mg by mouth daily.    Marland Kitchen atorvastatin (LIPITOR) 10 MG tablet Take 10 mg by mouth daily.    Marland Kitchen escitalopram (LEXAPRO) 20 MG tablet Take 1 tablet (20 mg total) by mouth daily. 30 tablet 0  . Tiotropium Bromide-Olodaterol (STIOLTO RESPIMAT) 2.5-2.5 MCG/ACT AERS Inhale 2 Inhalers into the lungs daily.    . isosorbide dinitrate (ISORDIL) 10 MG tablet TAKE 1 TABLET BY MOUTH THREE TIMES DAILY 270 tablet 3  . OXYCODONE HCL PO Take 15 mg by mouth every 4 (four) hours as needed.     Marland Kitchen oxyCODONE (ROXICODONE) 15 MG immediate release tablet Take 15 mg by mouth 4 (four) times daily as needed.    . busPIRone (BUSPAR) 5 MG tablet Take 1 tablet (5 mg total) by mouth 3 (three) times daily. (Patient not taking: Reported on 10/03/2019) 90 tablet 0   No facility-administered medications prior to visit.     Allergies:   Penicillins and Sulfa antibiotics   Social History   Socioeconomic History  . Marital status: Single  Spouse name: Not on file  . Number of children: Not on file  . Years of education: Not on file  . Highest education level: Not on file  Occupational History  . Not on file  Tobacco Use  . Smoking status: Current Every Day Smoker    Packs/day: 1.00    Types: Cigarettes    Start date: 12/21/1973  . Smokeless tobacco: Never Used  Vaping Use  . Vaping Use: Never used  Substance and Sexual Activity  . Alcohol use: No  . Drug use: No  . Sexual activity: Never  Other Topics Concern  . Not on file  Social History Narrative   Lives in [place], [place]   On disability for COPD, back, mood.   Followed by Dr. Modesta Messing in Psychiatry.   Eats all food groups.    Lives with roommate,  who is ex-husband.   Has one daughter, 2 grandchildren.   Enjoys reading and family.   Used to ride horses. Enjoys being outside.    Born in [place], [place].   Social Determinants of Health   Financial Resource Strain:   . Difficulty of Paying Living Expenses:   Food Insecurity:   . Worried About Charity fundraiser in the Last Year:   . Arboriculturist in the Last Year:   Transportation Needs:   . Film/video editor (Medical):   Marland Kitchen Lack of Transportation (Non-Medical):   Physical Activity:   . Days of Exercise per Week:   . Minutes of Exercise per Session:   Stress:   . Feeling of Stress :   Social Connections:   . Frequency of Communication with Friends and Family:   . Frequency of Social Gatherings with Friends and Family:   . Attends Religious Services:   . Active Member of Clubs or Organizations:   . Attends Archivist Meetings:   Marland Kitchen Marital Status:      Family History:  The patient's family history includes Alcohol abuse in her maternal uncle and paternal uncle; Bipolar disorder in her daughter; CAD in her mother; Dementia in her mother; Diabetes in her daughter; Heart attack in her mother.   Review of Systems:   Please see the history of present illness.     General:  No chills, fever, night sweats or weight changes.  Cardiovascular:  No dyspnea on exertion, edema, orthopnea, palpitations, paroxysmal nocturnal dyspnea. Positive for chest pain.  Dermatological: No rash, lesions/masses Respiratory: No cough, dyspnea Urologic: No hematuria, dysuria Abdominal:   No nausea, vomiting, diarrhea, bright red blood per rectum, melena, or hematemesis Neurologic:  No visual changes, wkns, changes in mental status. All other systems reviewed and are otherwise negative except as noted above.   Physical Exam:    VS:  BP (!) 126/62 (BP Location: Right Arm, Patient Position: Sitting, Cuff Size: Normal)   Pulse 74   Ht 5\' 2"  (1.575 m)   Wt 111 lb 3.2 oz (50.4 kg)   SpO2  90%   BMI 20.34 kg/m    General: Well developed, well nourished,female appearing in no acute distress. Head: Normocephalic, atraumatic, sclera non-icteric.  Neck: No carotid bruits. JVD not elevated.  Lungs: Respirations regular and unlabored, without wheezes or rales.  Heart: Regular rate and rhythm. No S3 or S4.  No murmur, no rubs, or gallops appreciated. Abdomen: Soft, non-tender, non-distended. No obvious abdominal masses. Msk:  Strength and tone appear normal for age. No obvious joint deformities or effusions. Extremities: No clubbing or cyanosis.  No lower extremity edema.  Distal pedal pulses are 2+ bilaterally. Neuro: Alert and oriented X 3. Moves all extremities spontaneously. No focal deficits noted. Psych:  Responds to questions appropriately with a normal affect. Skin: No rashes or lesions noted  Wt Readings from Last 3 Encounters:  10/03/19 111 lb 3.2 oz (50.4 kg)  02/15/18 118 lb (53.5 kg)  05/10/17 113 lb 8 oz (51.5 kg)     Studies/Labs Reviewed:   EKG:  EKG is ordered today.  The ekg ordered today demonstrates NSR, HR 74 with no acute ST abnormalities.   Recent Labs: No results found for requested labs within last 8760 hours.   Lipid Panel    Component Value Date/Time   CHOL 130 05/10/2017 1159   TRIG 76 05/10/2017 1159   HDL 48 (L) 05/10/2017 1159   CHOLHDL 2.7 05/10/2017 1159   LDLCALC 66 05/10/2017 1159    Additional studies/ records that were reviewed today include:   Stress Echocardiogram: 2017   Holter Monitor: 12/2015 Representative strips from 48 hour Holter monitor reviewed. Sinus rhythm was present throughout with rare PACs and PVCs. Heart rate ranged from 55 bpm up to 96 bpm with average heart rate 71 bpm. No sustained arrhythmias or pauses. No specific correlation with described symptoms and any particular arrhythmia or heart rate.   ABI's: 02/2018 FINDINGS: Right ABI:  1.05  Left ABI:  1.04  Right Lower Extremity:  Normal arterial  waveforms at the ankle.  Left Lower Extremity:  Normal arterial waveforms at the ankle.  IMPRESSION: No evidence of hemodynamically significant lower extremity arterial occlusive disease at rest.   Assessment:    1. Chest pain, unspecified type   2. Essential hypertension   3. Mixed hyperlipidemia   4. Tobacco use      Plan:   In order of problems listed above:  1. Chest Pain with Mixed Features - She describes episodes of chest pain which occur at rest or with activity but are relieved with Isordil which she reports good compliance with. We discussed switching to a once daily nitrate such as Imdur but she wishes to continue her current regimen.  - It has been over 4 years since her last stress test and given her more frequent episodes of chest pain and her cardiac risk factors (HTN, HLD, tobacco use and family history of CAD), will plan to obtain a Lexiscan Myoview for ischemic evaluation.  - Continue ASA 81mg  daily, Atorvastatin 10mg  daily and Isordil 10mg  TID.   2. HTN - BP is well-controlled at 126/62 during today's visit. Continue current medication regimen with Amlodipine and Isordil.   3. HLD - Followed by her PCP. LDL was 66 in 2019. Will request a copy of her most recent records. She remains on Atorvastatin 10mg  daily.   4. Tobacco Use - She smokes less than 1 ppd. Nicotine replacement previously not covered by insurance and she was unsuccessful with Chantix in the past. She does wish to quit and is trying to reduce her use.    Medication Adjustments/Labs and Tests Ordered: Current medicines are reviewed at length with the patient today.  Concerns regarding medicines are outlined above.  Medication changes, Labs and Tests ordered today are listed in the Patient Instructions below. Patient Instructions  Medication Instructions:  Your physician recommends that you continue on your current medications as directed. Please refer to the Current Medication list given to  you today.  *If you need a refill on your cardiac medications before your next  appointment, please call your pharmacy*   Lab Work: NONE   If you have labs (blood work) drawn today and your tests are completely normal, you will receive your results only by: Marland Kitchen MyChart Message (if you have MyChart) OR . A paper copy in the mail If you have any lab test that is abnormal or we need to change your treatment, we will call you to review the results.   Testing/Procedures: Your physician has requested that you have a lexiscan myoview. For further information please visit [email or web address]. Please follow instruction sheet, as given.  Follow-Up: At [facility], you and your health needs are our priority.  As part of our continuing mission to provide you with exceptional heart care, we have created designated Provider Care Teams.  These Care Teams include your primary Cardiologist (physician) and Advanced Practice Providers (APPs -  Physician Assistants and Nurse Practitioners) who all work together to provide you with the care you need, when you need it.  We recommend signing up for the patient portal called "MyChart".  Sign up information is provided on this After Visit Summary.  MyChart is used to connect with patients for Virtual Visits (Telemedicine).  Patients are able to view lab/test results, encounter notes, upcoming appointments, etc.  Non-urgent messages can be sent to your provider as well.   To learn more about what you can do with MyChart, go to [email or web address].    Your next appointment:   1 year(s)  The format for your next appointment:   In Person  Provider:   Rozann Lesches, MD   Other Instructions Thank you for choosing [facility]!       Signed, Erma Heritage, PA-C  10/03/2019 7:37 PM    [facility] [address] [place], [place] [address] Phone: [phone] Fax: [phone]

## 2019-10-03 NOTE — Patient Instructions (Signed)
Medication Instructions:  Your physician recommends that you continue on your current medications as directed. Please refer to the Current Medication list given to you today.  *If you need a refill on your cardiac medications before your next appointment, please call your pharmacy*   Lab Work: NONE   If you have labs (blood work) drawn today and your tests are completely normal, you will receive your results only by: . MyChart Message (if you have MyChart) OR . A paper copy in the mail If you have any lab test that is abnormal or we need to change your treatment, we will call you to review the results.   Testing/Procedures: Your physician has requested that you have a lexiscan myoview. For further information please visit [email or web address]. Please follow instruction sheet, as given.  Follow-Up: At [facility], you and your health needs are our priority.  As part of our continuing mission to provide you with exceptional heart care, we have created designated Provider Care Teams.  These Care Teams include your primary Cardiologist (physician) and Advanced Practice Providers (APPs -  Physician Assistants and Nurse Practitioners) who all work together to provide you with the care you need, when you need it.  We recommend signing up for the patient portal called "MyChart".  Sign up information is provided on this After Visit Summary.  MyChart is used to connect with patients for Virtual Visits (Telemedicine).  Patients are able to view lab/test results, encounter notes, upcoming appointments, etc.  Non-urgent messages can be sent to your provider as well.   To learn more about what you can do with MyChart, go to [email or web address].    Your next appointment:   1 year(s)  The format for your next appointment:   In Person  Provider:   Samuel McDowell, MD   Other Instructions Thank you for choosing [facility]!    

## 2019-10-10 DIAGNOSIS — M5136 Other intervertebral disc degeneration, lumbar region: Secondary | ICD-10-CM | POA: Diagnosis not present

## 2019-10-10 DIAGNOSIS — G894 Chronic pain syndrome: Secondary | ICD-10-CM | POA: Diagnosis not present

## 2019-10-10 DIAGNOSIS — M47817 Spondylosis without myelopathy or radiculopathy, lumbosacral region: Secondary | ICD-10-CM | POA: Diagnosis not present

## 2019-10-17 ENCOUNTER — Other Ambulatory Visit (HOSPITAL_COMMUNITY): Payer: Medicare Other

## 2019-10-17 ENCOUNTER — Encounter (HOSPITAL_COMMUNITY): Payer: Medicare Other

## 2019-10-18 DIAGNOSIS — E7849 Other hyperlipidemia: Secondary | ICD-10-CM | POA: Diagnosis not present

## 2019-10-18 DIAGNOSIS — E782 Mixed hyperlipidemia: Secondary | ICD-10-CM | POA: Diagnosis not present

## 2019-10-18 DIAGNOSIS — D45 Polycythemia vera: Secondary | ICD-10-CM | POA: Diagnosis not present

## 2019-10-18 DIAGNOSIS — D729 Disorder of white blood cells, unspecified: Secondary | ICD-10-CM | POA: Diagnosis not present

## 2019-10-18 DIAGNOSIS — D72829 Elevated white blood cell count, unspecified: Secondary | ICD-10-CM | POA: Diagnosis not present

## 2019-11-05 DIAGNOSIS — I209 Angina pectoris, unspecified: Secondary | ICD-10-CM | POA: Diagnosis not present

## 2019-11-05 DIAGNOSIS — Z0001 Encounter for general adult medical examination with abnormal findings: Secondary | ICD-10-CM | POA: Diagnosis not present

## 2019-11-05 DIAGNOSIS — G4733 Obstructive sleep apnea (adult) (pediatric): Secondary | ICD-10-CM | POA: Diagnosis not present

## 2019-11-05 DIAGNOSIS — J441 Chronic obstructive pulmonary disease with (acute) exacerbation: Secondary | ICD-10-CM | POA: Diagnosis not present

## 2019-11-05 DIAGNOSIS — I10 Essential (primary) hypertension: Secondary | ICD-10-CM | POA: Diagnosis not present

## 2019-11-13 DIAGNOSIS — Z79899 Other long term (current) drug therapy: Secondary | ICD-10-CM | POA: Diagnosis not present

## 2019-11-13 DIAGNOSIS — Z79891 Long term (current) use of opiate analgesic: Secondary | ICD-10-CM | POA: Diagnosis not present

## 2019-11-13 DIAGNOSIS — M47816 Spondylosis without myelopathy or radiculopathy, lumbar region: Secondary | ICD-10-CM | POA: Diagnosis not present

## 2019-11-13 DIAGNOSIS — M79606 Pain in leg, unspecified: Secondary | ICD-10-CM | POA: Diagnosis not present

## 2019-11-13 DIAGNOSIS — G894 Chronic pain syndrome: Secondary | ICD-10-CM | POA: Diagnosis not present

## 2019-11-13 DIAGNOSIS — M533 Sacrococcygeal disorders, not elsewhere classified: Secondary | ICD-10-CM | POA: Diagnosis not present

## 2019-11-16 ENCOUNTER — Encounter (HOSPITAL_COMMUNITY): Payer: Medicare Other

## 2019-11-22 DIAGNOSIS — I10 Essential (primary) hypertension: Secondary | ICD-10-CM | POA: Diagnosis not present

## 2019-11-22 DIAGNOSIS — Z720 Tobacco use: Secondary | ICD-10-CM | POA: Diagnosis not present

## 2019-11-22 DIAGNOSIS — E7849 Other hyperlipidemia: Secondary | ICD-10-CM | POA: Diagnosis not present

## 2019-11-22 DIAGNOSIS — J441 Chronic obstructive pulmonary disease with (acute) exacerbation: Secondary | ICD-10-CM | POA: Diagnosis not present

## 2019-12-13 DIAGNOSIS — M47816 Spondylosis without myelopathy or radiculopathy, lumbar region: Secondary | ICD-10-CM | POA: Diagnosis not present

## 2019-12-13 DIAGNOSIS — M5137 Other intervertebral disc degeneration, lumbosacral region: Secondary | ICD-10-CM | POA: Diagnosis not present

## 2019-12-13 DIAGNOSIS — M533 Sacrococcygeal disorders, not elsewhere classified: Secondary | ICD-10-CM | POA: Diagnosis not present

## 2019-12-13 DIAGNOSIS — G894 Chronic pain syndrome: Secondary | ICD-10-CM | POA: Diagnosis not present

## 2019-12-27 DIAGNOSIS — E7849 Other hyperlipidemia: Secondary | ICD-10-CM | POA: Diagnosis not present

## 2019-12-27 DIAGNOSIS — I10 Essential (primary) hypertension: Secondary | ICD-10-CM | POA: Diagnosis not present

## 2019-12-27 DIAGNOSIS — J441 Chronic obstructive pulmonary disease with (acute) exacerbation: Secondary | ICD-10-CM | POA: Diagnosis not present

## 2019-12-27 DIAGNOSIS — Z720 Tobacco use: Secondary | ICD-10-CM | POA: Diagnosis not present

## 2020-01-10 DIAGNOSIS — G894 Chronic pain syndrome: Secondary | ICD-10-CM | POA: Diagnosis not present

## 2020-01-10 DIAGNOSIS — M79606 Pain in leg, unspecified: Secondary | ICD-10-CM | POA: Diagnosis not present

## 2020-01-10 DIAGNOSIS — M533 Sacrococcygeal disorders, not elsewhere classified: Secondary | ICD-10-CM | POA: Diagnosis not present

## 2020-01-10 DIAGNOSIS — M47816 Spondylosis without myelopathy or radiculopathy, lumbar region: Secondary | ICD-10-CM | POA: Diagnosis not present

## 2020-01-16 DIAGNOSIS — E7849 Other hyperlipidemia: Secondary | ICD-10-CM | POA: Diagnosis not present

## 2020-01-16 DIAGNOSIS — I10 Essential (primary) hypertension: Secondary | ICD-10-CM | POA: Diagnosis not present

## 2020-01-16 DIAGNOSIS — Z720 Tobacco use: Secondary | ICD-10-CM | POA: Diagnosis not present

## 2020-01-16 DIAGNOSIS — J441 Chronic obstructive pulmonary disease with (acute) exacerbation: Secondary | ICD-10-CM | POA: Diagnosis not present

## 2020-02-06 DIAGNOSIS — G894 Chronic pain syndrome: Secondary | ICD-10-CM | POA: Diagnosis not present

## 2020-02-06 DIAGNOSIS — M5136 Other intervertebral disc degeneration, lumbar region: Secondary | ICD-10-CM | POA: Diagnosis not present

## 2020-02-06 DIAGNOSIS — M5134 Other intervertebral disc degeneration, thoracic region: Secondary | ICD-10-CM | POA: Diagnosis not present

## 2020-02-06 DIAGNOSIS — Z79899 Other long term (current) drug therapy: Secondary | ICD-10-CM | POA: Diagnosis not present

## 2020-02-06 DIAGNOSIS — Z79891 Long term (current) use of opiate analgesic: Secondary | ICD-10-CM | POA: Diagnosis not present

## 2020-02-06 DIAGNOSIS — M542 Cervicalgia: Secondary | ICD-10-CM | POA: Diagnosis not present

## 2020-02-11 DIAGNOSIS — M5134 Other intervertebral disc degeneration, thoracic region: Secondary | ICD-10-CM | POA: Diagnosis not present

## 2020-02-11 DIAGNOSIS — M47814 Spondylosis without myelopathy or radiculopathy, thoracic region: Secondary | ICD-10-CM | POA: Diagnosis not present

## 2020-02-20 DIAGNOSIS — M47817 Spondylosis without myelopathy or radiculopathy, lumbosacral region: Secondary | ICD-10-CM | POA: Diagnosis not present

## 2020-03-05 DIAGNOSIS — M47817 Spondylosis without myelopathy or radiculopathy, lumbosacral region: Secondary | ICD-10-CM | POA: Diagnosis not present

## 2020-04-02 DIAGNOSIS — M47817 Spondylosis without myelopathy or radiculopathy, lumbosacral region: Secondary | ICD-10-CM | POA: Diagnosis not present

## 2020-04-02 DIAGNOSIS — M5134 Other intervertebral disc degeneration, thoracic region: Secondary | ICD-10-CM | POA: Diagnosis not present

## 2020-04-02 DIAGNOSIS — M5136 Other intervertebral disc degeneration, lumbar region: Secondary | ICD-10-CM | POA: Diagnosis not present

## 2020-04-02 DIAGNOSIS — G894 Chronic pain syndrome: Secondary | ICD-10-CM | POA: Diagnosis not present

## 2020-04-05 DIAGNOSIS — I10 Essential (primary) hypertension: Secondary | ICD-10-CM | POA: Diagnosis not present

## 2020-04-05 DIAGNOSIS — E7849 Other hyperlipidemia: Secondary | ICD-10-CM | POA: Diagnosis not present

## 2020-04-05 DIAGNOSIS — J441 Chronic obstructive pulmonary disease with (acute) exacerbation: Secondary | ICD-10-CM | POA: Diagnosis not present

## 2020-04-30 DIAGNOSIS — Z79891 Long term (current) use of opiate analgesic: Secondary | ICD-10-CM | POA: Diagnosis not present

## 2020-04-30 DIAGNOSIS — M47814 Spondylosis without myelopathy or radiculopathy, thoracic region: Secondary | ICD-10-CM | POA: Diagnosis not present

## 2020-04-30 DIAGNOSIS — M5134 Other intervertebral disc degeneration, thoracic region: Secondary | ICD-10-CM | POA: Diagnosis not present

## 2020-04-30 DIAGNOSIS — Z79899 Other long term (current) drug therapy: Secondary | ICD-10-CM | POA: Diagnosis not present

## 2020-04-30 DIAGNOSIS — G894 Chronic pain syndrome: Secondary | ICD-10-CM | POA: Diagnosis not present

## 2020-04-30 DIAGNOSIS — M5136 Other intervertebral disc degeneration, lumbar region: Secondary | ICD-10-CM | POA: Diagnosis not present

## 2020-05-05 DIAGNOSIS — I10 Essential (primary) hypertension: Secondary | ICD-10-CM | POA: Diagnosis not present

## 2020-05-05 DIAGNOSIS — D45 Polycythemia vera: Secondary | ICD-10-CM | POA: Diagnosis not present

## 2020-05-05 DIAGNOSIS — Z8619 Personal history of other infectious and parasitic diseases: Secondary | ICD-10-CM | POA: Diagnosis not present

## 2020-05-05 DIAGNOSIS — E782 Mixed hyperlipidemia: Secondary | ICD-10-CM | POA: Diagnosis not present

## 2020-05-05 DIAGNOSIS — G4733 Obstructive sleep apnea (adult) (pediatric): Secondary | ICD-10-CM | POA: Diagnosis not present

## 2020-05-05 DIAGNOSIS — D72829 Elevated white blood cell count, unspecified: Secondary | ICD-10-CM | POA: Diagnosis not present

## 2020-05-05 DIAGNOSIS — I209 Angina pectoris, unspecified: Secondary | ICD-10-CM | POA: Diagnosis not present

## 2020-05-19 DIAGNOSIS — D72829 Elevated white blood cell count, unspecified: Secondary | ICD-10-CM | POA: Diagnosis not present

## 2020-05-19 DIAGNOSIS — R238 Other skin changes: Secondary | ICD-10-CM | POA: Diagnosis not present

## 2020-05-19 DIAGNOSIS — E782 Mixed hyperlipidemia: Secondary | ICD-10-CM | POA: Diagnosis not present

## 2020-05-19 DIAGNOSIS — I10 Essential (primary) hypertension: Secondary | ICD-10-CM | POA: Diagnosis not present

## 2020-05-19 DIAGNOSIS — D45 Polycythemia vera: Secondary | ICD-10-CM | POA: Diagnosis not present

## 2020-05-20 ENCOUNTER — Other Ambulatory Visit (HOSPITAL_COMMUNITY): Payer: Self-pay | Admitting: Internal Medicine

## 2020-05-20 DIAGNOSIS — E782 Mixed hyperlipidemia: Secondary | ICD-10-CM | POA: Diagnosis not present

## 2020-05-20 DIAGNOSIS — F17218 Nicotine dependence, cigarettes, with other nicotine-induced disorders: Secondary | ICD-10-CM | POA: Diagnosis not present

## 2020-05-20 DIAGNOSIS — Z13820 Encounter for screening for osteoporosis: Secondary | ICD-10-CM

## 2020-05-20 DIAGNOSIS — Z8619 Personal history of other infectious and parasitic diseases: Secondary | ICD-10-CM | POA: Diagnosis not present

## 2020-05-20 DIAGNOSIS — G4733 Obstructive sleep apnea (adult) (pediatric): Secondary | ICD-10-CM | POA: Diagnosis not present

## 2020-05-20 DIAGNOSIS — D45 Polycythemia vera: Secondary | ICD-10-CM | POA: Diagnosis not present

## 2020-05-20 DIAGNOSIS — I10 Essential (primary) hypertension: Secondary | ICD-10-CM | POA: Diagnosis not present

## 2020-05-20 DIAGNOSIS — J441 Chronic obstructive pulmonary disease with (acute) exacerbation: Secondary | ICD-10-CM | POA: Diagnosis not present

## 2020-05-20 DIAGNOSIS — I209 Angina pectoris, unspecified: Secondary | ICD-10-CM | POA: Diagnosis not present

## 2020-05-20 DIAGNOSIS — G4730 Sleep apnea, unspecified: Secondary | ICD-10-CM | POA: Diagnosis not present

## 2020-05-20 DIAGNOSIS — J449 Chronic obstructive pulmonary disease, unspecified: Secondary | ICD-10-CM | POA: Diagnosis not present

## 2020-05-27 ENCOUNTER — Other Ambulatory Visit (HOSPITAL_COMMUNITY): Payer: Medicare Other

## 2020-06-04 DIAGNOSIS — I10 Essential (primary) hypertension: Secondary | ICD-10-CM | POA: Diagnosis not present

## 2020-06-04 DIAGNOSIS — F17210 Nicotine dependence, cigarettes, uncomplicated: Secondary | ICD-10-CM | POA: Diagnosis not present

## 2020-06-04 DIAGNOSIS — G4733 Obstructive sleep apnea (adult) (pediatric): Secondary | ICD-10-CM | POA: Diagnosis not present

## 2020-06-04 DIAGNOSIS — I209 Angina pectoris, unspecified: Secondary | ICD-10-CM | POA: Diagnosis not present

## 2020-06-04 DIAGNOSIS — D45 Polycythemia vera: Secondary | ICD-10-CM | POA: Diagnosis not present

## 2020-06-04 DIAGNOSIS — F17218 Nicotine dependence, cigarettes, with other nicotine-induced disorders: Secondary | ICD-10-CM | POA: Diagnosis not present

## 2020-06-04 DIAGNOSIS — E782 Mixed hyperlipidemia: Secondary | ICD-10-CM | POA: Diagnosis not present

## 2020-06-04 DIAGNOSIS — Z8619 Personal history of other infectious and parasitic diseases: Secondary | ICD-10-CM | POA: Diagnosis not present

## 2020-06-04 DIAGNOSIS — J441 Chronic obstructive pulmonary disease with (acute) exacerbation: Secondary | ICD-10-CM | POA: Diagnosis not present

## 2020-06-09 ENCOUNTER — Other Ambulatory Visit (HOSPITAL_COMMUNITY): Payer: Medicare Other

## 2020-06-09 DIAGNOSIS — M5134 Other intervertebral disc degeneration, thoracic region: Secondary | ICD-10-CM | POA: Diagnosis not present

## 2020-06-09 DIAGNOSIS — M5136 Other intervertebral disc degeneration, lumbar region: Secondary | ICD-10-CM | POA: Diagnosis not present

## 2020-06-09 DIAGNOSIS — G894 Chronic pain syndrome: Secondary | ICD-10-CM | POA: Diagnosis not present

## 2020-06-09 DIAGNOSIS — M542 Cervicalgia: Secondary | ICD-10-CM | POA: Diagnosis not present

## 2020-07-06 DIAGNOSIS — E7849 Other hyperlipidemia: Secondary | ICD-10-CM | POA: Diagnosis not present

## 2020-07-06 DIAGNOSIS — I10 Essential (primary) hypertension: Secondary | ICD-10-CM | POA: Diagnosis not present

## 2020-07-09 DIAGNOSIS — Z79891 Long term (current) use of opiate analgesic: Secondary | ICD-10-CM | POA: Diagnosis not present

## 2020-07-09 DIAGNOSIS — G894 Chronic pain syndrome: Secondary | ICD-10-CM | POA: Diagnosis not present

## 2020-07-09 DIAGNOSIS — Z79899 Other long term (current) drug therapy: Secondary | ICD-10-CM | POA: Diagnosis not present

## 2020-07-09 DIAGNOSIS — M47817 Spondylosis without myelopathy or radiculopathy, lumbosacral region: Secondary | ICD-10-CM | POA: Diagnosis not present

## 2020-07-09 DIAGNOSIS — M5136 Other intervertebral disc degeneration, lumbar region: Secondary | ICD-10-CM | POA: Diagnosis not present

## 2020-07-09 DIAGNOSIS — M5134 Other intervertebral disc degeneration, thoracic region: Secondary | ICD-10-CM | POA: Diagnosis not present

## 2020-07-10 DIAGNOSIS — G894 Chronic pain syndrome: Secondary | ICD-10-CM | POA: Diagnosis not present

## 2020-07-10 DIAGNOSIS — Z79899 Other long term (current) drug therapy: Secondary | ICD-10-CM | POA: Diagnosis not present

## 2020-07-10 DIAGNOSIS — Z79891 Long term (current) use of opiate analgesic: Secondary | ICD-10-CM | POA: Diagnosis not present

## 2020-07-23 DIAGNOSIS — E785 Hyperlipidemia, unspecified: Secondary | ICD-10-CM | POA: Diagnosis not present

## 2020-07-23 DIAGNOSIS — I10 Essential (primary) hypertension: Secondary | ICD-10-CM | POA: Diagnosis not present

## 2020-07-23 DIAGNOSIS — R636 Underweight: Secondary | ICD-10-CM | POA: Diagnosis not present

## 2020-07-28 DIAGNOSIS — J449 Chronic obstructive pulmonary disease, unspecified: Secondary | ICD-10-CM | POA: Diagnosis not present

## 2020-07-28 DIAGNOSIS — E785 Hyperlipidemia, unspecified: Secondary | ICD-10-CM | POA: Diagnosis not present

## 2020-07-28 DIAGNOSIS — R636 Underweight: Secondary | ICD-10-CM | POA: Diagnosis not present

## 2020-07-28 DIAGNOSIS — I10 Essential (primary) hypertension: Secondary | ICD-10-CM | POA: Diagnosis not present

## 2020-07-28 DIAGNOSIS — G894 Chronic pain syndrome: Secondary | ICD-10-CM | POA: Diagnosis not present

## 2020-08-06 DIAGNOSIS — M5134 Other intervertebral disc degeneration, thoracic region: Secondary | ICD-10-CM | POA: Diagnosis not present

## 2020-08-06 DIAGNOSIS — M47817 Spondylosis without myelopathy or radiculopathy, lumbosacral region: Secondary | ICD-10-CM | POA: Diagnosis not present

## 2020-08-06 DIAGNOSIS — M5136 Other intervertebral disc degeneration, lumbar region: Secondary | ICD-10-CM | POA: Diagnosis not present

## 2020-08-06 DIAGNOSIS — G894 Chronic pain syndrome: Secondary | ICD-10-CM | POA: Diagnosis not present

## 2020-08-14 ENCOUNTER — Encounter: Payer: Self-pay | Admitting: *Deleted

## 2020-08-18 DIAGNOSIS — R634 Abnormal weight loss: Secondary | ICD-10-CM | POA: Diagnosis not present

## 2020-08-26 DIAGNOSIS — E7849 Other hyperlipidemia: Secondary | ICD-10-CM | POA: Diagnosis not present

## 2020-08-26 DIAGNOSIS — I10 Essential (primary) hypertension: Secondary | ICD-10-CM | POA: Diagnosis not present

## 2020-09-03 DIAGNOSIS — M47817 Spondylosis without myelopathy or radiculopathy, lumbosacral region: Secondary | ICD-10-CM | POA: Diagnosis not present

## 2020-09-03 DIAGNOSIS — G894 Chronic pain syndrome: Secondary | ICD-10-CM | POA: Diagnosis not present

## 2020-09-03 DIAGNOSIS — M5134 Other intervertebral disc degeneration, thoracic region: Secondary | ICD-10-CM | POA: Diagnosis not present

## 2020-09-03 DIAGNOSIS — M5136 Other intervertebral disc degeneration, lumbar region: Secondary | ICD-10-CM | POA: Diagnosis not present

## 2020-09-17 DIAGNOSIS — M47817 Spondylosis without myelopathy or radiculopathy, lumbosacral region: Secondary | ICD-10-CM | POA: Diagnosis not present

## 2020-10-01 ENCOUNTER — Ambulatory Visit: Payer: Medicare Other | Admitting: Internal Medicine

## 2020-10-01 ENCOUNTER — Encounter: Payer: Self-pay | Admitting: Internal Medicine

## 2020-10-01 DIAGNOSIS — M5136 Other intervertebral disc degeneration, lumbar region: Secondary | ICD-10-CM | POA: Diagnosis not present

## 2020-10-01 DIAGNOSIS — M545 Low back pain, unspecified: Secondary | ICD-10-CM | POA: Diagnosis not present

## 2020-10-01 DIAGNOSIS — M4724 Other spondylosis with radiculopathy, thoracic region: Secondary | ICD-10-CM | POA: Diagnosis not present

## 2020-10-01 DIAGNOSIS — G894 Chronic pain syndrome: Secondary | ICD-10-CM | POA: Diagnosis not present

## 2020-10-01 DIAGNOSIS — M47817 Spondylosis without myelopathy or radiculopathy, lumbosacral region: Secondary | ICD-10-CM | POA: Diagnosis not present

## 2020-10-28 ENCOUNTER — Other Ambulatory Visit: Payer: Self-pay | Admitting: Family Medicine

## 2020-10-28 ENCOUNTER — Other Ambulatory Visit (HOSPITAL_COMMUNITY): Payer: Self-pay | Admitting: Family Medicine

## 2020-10-28 DIAGNOSIS — R634 Abnormal weight loss: Secondary | ICD-10-CM

## 2020-10-28 DIAGNOSIS — I10 Essential (primary) hypertension: Secondary | ICD-10-CM | POA: Diagnosis not present

## 2020-10-28 DIAGNOSIS — E785 Hyperlipidemia, unspecified: Secondary | ICD-10-CM | POA: Diagnosis not present

## 2020-11-05 DIAGNOSIS — I10 Essential (primary) hypertension: Secondary | ICD-10-CM | POA: Diagnosis not present

## 2020-11-05 DIAGNOSIS — E119 Type 2 diabetes mellitus without complications: Secondary | ICD-10-CM | POA: Diagnosis not present

## 2020-11-05 DIAGNOSIS — E7849 Other hyperlipidemia: Secondary | ICD-10-CM | POA: Diagnosis not present

## 2020-11-06 DIAGNOSIS — M5136 Other intervertebral disc degeneration, lumbar region: Secondary | ICD-10-CM | POA: Diagnosis not present

## 2020-11-06 DIAGNOSIS — Z79899 Other long term (current) drug therapy: Secondary | ICD-10-CM | POA: Diagnosis not present

## 2020-11-06 DIAGNOSIS — M47814 Spondylosis without myelopathy or radiculopathy, thoracic region: Secondary | ICD-10-CM | POA: Diagnosis not present

## 2020-11-06 DIAGNOSIS — M5134 Other intervertebral disc degeneration, thoracic region: Secondary | ICD-10-CM | POA: Diagnosis not present

## 2020-11-06 DIAGNOSIS — Z79891 Long term (current) use of opiate analgesic: Secondary | ICD-10-CM | POA: Diagnosis not present

## 2020-11-06 DIAGNOSIS — G894 Chronic pain syndrome: Secondary | ICD-10-CM | POA: Diagnosis not present

## 2020-12-01 ENCOUNTER — Encounter (HOSPITAL_COMMUNITY): Payer: Self-pay

## 2020-12-01 ENCOUNTER — Ambulatory Visit (HOSPITAL_COMMUNITY): Payer: Medicare Other

## 2020-12-05 DIAGNOSIS — I10 Essential (primary) hypertension: Secondary | ICD-10-CM | POA: Diagnosis not present

## 2020-12-05 DIAGNOSIS — E7849 Other hyperlipidemia: Secondary | ICD-10-CM | POA: Diagnosis not present

## 2020-12-08 DIAGNOSIS — M5134 Other intervertebral disc degeneration, thoracic region: Secondary | ICD-10-CM | POA: Diagnosis not present

## 2020-12-08 DIAGNOSIS — M47817 Spondylosis without myelopathy or radiculopathy, lumbosacral region: Secondary | ICD-10-CM | POA: Diagnosis not present

## 2020-12-08 DIAGNOSIS — M5136 Other intervertebral disc degeneration, lumbar region: Secondary | ICD-10-CM | POA: Diagnosis not present

## 2020-12-08 DIAGNOSIS — G894 Chronic pain syndrome: Secondary | ICD-10-CM | POA: Diagnosis not present

## 2020-12-23 ENCOUNTER — Other Ambulatory Visit: Payer: Self-pay

## 2020-12-23 ENCOUNTER — Ambulatory Visit (HOSPITAL_COMMUNITY)
Admission: RE | Admit: 2020-12-23 | Discharge: 2020-12-23 | Disposition: A | Discharge status: Home / Self Care | Payer: Medicare Other | Source: Ambulatory Visit | Attending: Family Medicine | Admitting: Family Medicine

## 2020-12-23 ENCOUNTER — Other Ambulatory Visit (HOSPITAL_COMMUNITY): Payer: Self-pay | Admitting: Family Medicine

## 2020-12-23 DIAGNOSIS — I2510 Atherosclerotic heart disease of native coronary artery without angina pectoris: Secondary | ICD-10-CM | POA: Diagnosis not present

## 2020-12-23 DIAGNOSIS — N200 Calculus of kidney: Secondary | ICD-10-CM | POA: Diagnosis not present

## 2020-12-23 DIAGNOSIS — R634 Abnormal weight loss: Secondary | ICD-10-CM | POA: Insufficient documentation

## 2020-12-23 DIAGNOSIS — I700 Atherosclerosis of aorta: Secondary | ICD-10-CM | POA: Diagnosis not present

## 2020-12-23 DIAGNOSIS — J439 Emphysema, unspecified: Secondary | ICD-10-CM | POA: Diagnosis not present

## 2020-12-23 DIAGNOSIS — M542 Cervicalgia: Secondary | ICD-10-CM | POA: Diagnosis not present

## 2020-12-23 DIAGNOSIS — K7689 Other specified diseases of liver: Secondary | ICD-10-CM | POA: Diagnosis not present

## 2020-12-23 DIAGNOSIS — I10 Essential (primary) hypertension: Secondary | ICD-10-CM | POA: Diagnosis not present

## 2020-12-23 DIAGNOSIS — M25512 Pain in left shoulder: Secondary | ICD-10-CM | POA: Diagnosis not present

## 2020-12-23 DIAGNOSIS — E785 Hyperlipidemia, unspecified: Secondary | ICD-10-CM | POA: Diagnosis not present

## 2020-12-23 DIAGNOSIS — I898 Other specified noninfective disorders of lymphatic vessels and lymph nodes: Secondary | ICD-10-CM | POA: Diagnosis not present

## 2020-12-23 DIAGNOSIS — N261 Atrophy of kidney (terminal): Secondary | ICD-10-CM | POA: Diagnosis not present

## 2020-12-23 LAB — POCT I-STAT CREATININE: Creatinine, Ser: 0.6 mg/dL (ref 0.44–1.00)

## 2020-12-23 MED ORDER — IOHEXOL 300 MG/ML  SOLN
100.0000 mL | Freq: Once | INTRAMUSCULAR | Status: AC | PRN
  Administered 2020-12-23: 100 mL via INTRAVENOUS

## 2021-01-05 DIAGNOSIS — E782 Mixed hyperlipidemia: Secondary | ICD-10-CM | POA: Diagnosis not present

## 2021-01-05 DIAGNOSIS — M47817 Spondylosis without myelopathy or radiculopathy, lumbosacral region: Secondary | ICD-10-CM | POA: Diagnosis not present

## 2021-01-05 DIAGNOSIS — G894 Chronic pain syndrome: Secondary | ICD-10-CM | POA: Diagnosis not present

## 2021-01-05 DIAGNOSIS — M5136 Other intervertebral disc degeneration, lumbar region: Secondary | ICD-10-CM | POA: Diagnosis not present

## 2021-01-05 DIAGNOSIS — M4724 Other spondylosis with radiculopathy, thoracic region: Secondary | ICD-10-CM | POA: Diagnosis not present

## 2021-01-05 DIAGNOSIS — E7849 Other hyperlipidemia: Secondary | ICD-10-CM | POA: Diagnosis not present

## 2021-01-05 DIAGNOSIS — M549 Dorsalgia, unspecified: Secondary | ICD-10-CM | POA: Diagnosis not present

## 2021-01-05 DIAGNOSIS — I10 Essential (primary) hypertension: Secondary | ICD-10-CM | POA: Diagnosis not present

## 2021-01-28 DIAGNOSIS — E785 Hyperlipidemia, unspecified: Secondary | ICD-10-CM | POA: Diagnosis not present

## 2021-01-28 DIAGNOSIS — R634 Abnormal weight loss: Secondary | ICD-10-CM | POA: Diagnosis not present

## 2021-01-28 DIAGNOSIS — I10 Essential (primary) hypertension: Secondary | ICD-10-CM | POA: Diagnosis not present

## 2021-01-28 DIAGNOSIS — J449 Chronic obstructive pulmonary disease, unspecified: Secondary | ICD-10-CM | POA: Diagnosis not present

## 2021-02-04 DIAGNOSIS — M542 Cervicalgia: Secondary | ICD-10-CM | POA: Diagnosis not present

## 2021-02-04 DIAGNOSIS — M5136 Other intervertebral disc degeneration, lumbar region: Secondary | ICD-10-CM | POA: Diagnosis not present

## 2021-02-04 DIAGNOSIS — G894 Chronic pain syndrome: Secondary | ICD-10-CM | POA: Diagnosis not present

## 2021-02-04 DIAGNOSIS — M47817 Spondylosis without myelopathy or radiculopathy, lumbosacral region: Secondary | ICD-10-CM | POA: Diagnosis not present

## 2021-03-05 DIAGNOSIS — M47817 Spondylosis without myelopathy or radiculopathy, lumbosacral region: Secondary | ICD-10-CM | POA: Diagnosis not present

## 2021-03-05 DIAGNOSIS — M5136 Other intervertebral disc degeneration, lumbar region: Secondary | ICD-10-CM | POA: Diagnosis not present

## 2021-03-05 DIAGNOSIS — G894 Chronic pain syndrome: Secondary | ICD-10-CM | POA: Diagnosis not present

## 2021-03-05 DIAGNOSIS — M4724 Other spondylosis with radiculopathy, thoracic region: Secondary | ICD-10-CM | POA: Diagnosis not present

## 2021-03-06 DIAGNOSIS — I10 Essential (primary) hypertension: Secondary | ICD-10-CM | POA: Diagnosis not present

## 2021-03-06 DIAGNOSIS — E782 Mixed hyperlipidemia: Secondary | ICD-10-CM | POA: Diagnosis not present

## 2021-04-20 DIAGNOSIS — G894 Chronic pain syndrome: Secondary | ICD-10-CM | POA: Diagnosis not present

## 2021-04-20 DIAGNOSIS — M4724 Other spondylosis with radiculopathy, thoracic region: Secondary | ICD-10-CM | POA: Diagnosis not present

## 2021-04-20 DIAGNOSIS — M47817 Spondylosis without myelopathy or radiculopathy, lumbosacral region: Secondary | ICD-10-CM | POA: Diagnosis not present

## 2021-04-20 DIAGNOSIS — M5136 Other intervertebral disc degeneration, lumbar region: Secondary | ICD-10-CM | POA: Diagnosis not present

## 2021-04-27 DIAGNOSIS — I10 Essential (primary) hypertension: Secondary | ICD-10-CM | POA: Diagnosis not present

## 2021-04-30 DIAGNOSIS — R636 Underweight: Secondary | ICD-10-CM | POA: Diagnosis not present

## 2021-04-30 DIAGNOSIS — R809 Proteinuria, unspecified: Secondary | ICD-10-CM | POA: Diagnosis not present

## 2021-04-30 DIAGNOSIS — E785 Hyperlipidemia, unspecified: Secondary | ICD-10-CM | POA: Diagnosis not present

## 2021-04-30 DIAGNOSIS — I10 Essential (primary) hypertension: Secondary | ICD-10-CM | POA: Diagnosis not present

## 2021-04-30 DIAGNOSIS — G894 Chronic pain syndrome: Secondary | ICD-10-CM | POA: Diagnosis not present

## 2021-04-30 DIAGNOSIS — J449 Chronic obstructive pulmonary disease, unspecified: Secondary | ICD-10-CM | POA: Diagnosis not present

## 2021-04-30 DIAGNOSIS — R252 Cramp and spasm: Secondary | ICD-10-CM | POA: Diagnosis not present

## 2021-04-30 DIAGNOSIS — I208 Other forms of angina pectoris: Secondary | ICD-10-CM | POA: Diagnosis not present

## 2021-04-30 DIAGNOSIS — N261 Atrophy of kidney (terminal): Secondary | ICD-10-CM | POA: Diagnosis not present

## 2021-05-26 DIAGNOSIS — M545 Low back pain, unspecified: Secondary | ICD-10-CM | POA: Diagnosis not present

## 2021-05-27 DIAGNOSIS — R61 Generalized hyperhidrosis: Secondary | ICD-10-CM | POA: Diagnosis not present

## 2021-05-27 DIAGNOSIS — R809 Proteinuria, unspecified: Secondary | ICD-10-CM | POA: Diagnosis not present

## 2021-05-27 DIAGNOSIS — G894 Chronic pain syndrome: Secondary | ICD-10-CM | POA: Diagnosis not present

## 2021-05-27 DIAGNOSIS — I10 Essential (primary) hypertension: Secondary | ICD-10-CM | POA: Diagnosis not present

## 2021-06-29 DIAGNOSIS — G894 Chronic pain syndrome: Secondary | ICD-10-CM | POA: Diagnosis not present

## 2021-06-29 DIAGNOSIS — I10 Essential (primary) hypertension: Secondary | ICD-10-CM | POA: Diagnosis not present

## 2021-07-05 DIAGNOSIS — E785 Hyperlipidemia, unspecified: Secondary | ICD-10-CM | POA: Diagnosis not present

## 2021-07-05 DIAGNOSIS — J449 Chronic obstructive pulmonary disease, unspecified: Secondary | ICD-10-CM | POA: Diagnosis not present

## 2021-07-05 DIAGNOSIS — I10 Essential (primary) hypertension: Secondary | ICD-10-CM | POA: Diagnosis not present

## 2021-10-22 DIAGNOSIS — I10 Essential (primary) hypertension: Secondary | ICD-10-CM | POA: Diagnosis not present

## 2021-10-22 DIAGNOSIS — E785 Hyperlipidemia, unspecified: Secondary | ICD-10-CM | POA: Diagnosis not present

## 2021-11-26 DIAGNOSIS — I208 Other forms of angina pectoris: Secondary | ICD-10-CM | POA: Diagnosis not present

## 2021-11-26 DIAGNOSIS — Z0000 Encounter for general adult medical examination without abnormal findings: Secondary | ICD-10-CM | POA: Diagnosis not present

## 2021-11-26 DIAGNOSIS — R809 Proteinuria, unspecified: Secondary | ICD-10-CM | POA: Diagnosis not present

## 2021-11-26 DIAGNOSIS — G894 Chronic pain syndrome: Secondary | ICD-10-CM | POA: Diagnosis not present

## 2021-11-26 DIAGNOSIS — Z0001 Encounter for general adult medical examination with abnormal findings: Secondary | ICD-10-CM | POA: Diagnosis not present

## 2021-11-26 DIAGNOSIS — J449 Chronic obstructive pulmonary disease, unspecified: Secondary | ICD-10-CM | POA: Diagnosis not present

## 2021-11-26 DIAGNOSIS — E785 Hyperlipidemia, unspecified: Secondary | ICD-10-CM | POA: Diagnosis not present

## 2021-11-26 DIAGNOSIS — R252 Cramp and spasm: Secondary | ICD-10-CM | POA: Diagnosis not present

## 2021-11-26 DIAGNOSIS — I10 Essential (primary) hypertension: Secondary | ICD-10-CM | POA: Diagnosis not present

## 2022-05-28 DIAGNOSIS — I10 Essential (primary) hypertension: Secondary | ICD-10-CM | POA: Diagnosis not present

## 2022-05-28 DIAGNOSIS — E785 Hyperlipidemia, unspecified: Secondary | ICD-10-CM | POA: Diagnosis not present

## 2022-05-31 DIAGNOSIS — R809 Proteinuria, unspecified: Secondary | ICD-10-CM | POA: Diagnosis not present

## 2022-05-31 DIAGNOSIS — E871 Hypo-osmolality and hyponatremia: Secondary | ICD-10-CM | POA: Diagnosis not present

## 2022-05-31 DIAGNOSIS — I10 Essential (primary) hypertension: Secondary | ICD-10-CM | POA: Diagnosis not present

## 2022-05-31 DIAGNOSIS — R636 Underweight: Secondary | ICD-10-CM | POA: Diagnosis not present

## 2022-05-31 DIAGNOSIS — J449 Chronic obstructive pulmonary disease, unspecified: Secondary | ICD-10-CM | POA: Diagnosis not present

## 2022-05-31 DIAGNOSIS — G894 Chronic pain syndrome: Secondary | ICD-10-CM | POA: Diagnosis not present

## 2022-05-31 DIAGNOSIS — E875 Hyperkalemia: Secondary | ICD-10-CM | POA: Diagnosis not present

## 2022-05-31 DIAGNOSIS — I2089 Other forms of angina pectoris: Secondary | ICD-10-CM | POA: Diagnosis not present

## 2022-05-31 DIAGNOSIS — N261 Atrophy of kidney (terminal): Secondary | ICD-10-CM | POA: Diagnosis not present

## 2022-05-31 DIAGNOSIS — E785 Hyperlipidemia, unspecified: Secondary | ICD-10-CM | POA: Diagnosis not present

## 2022-11-25 DIAGNOSIS — I10 Essential (primary) hypertension: Secondary | ICD-10-CM | POA: Diagnosis not present

## 2022-12-01 DIAGNOSIS — J449 Chronic obstructive pulmonary disease, unspecified: Secondary | ICD-10-CM | POA: Diagnosis not present

## 2022-12-01 DIAGNOSIS — F17210 Nicotine dependence, cigarettes, uncomplicated: Secondary | ICD-10-CM | POA: Diagnosis not present

## 2022-12-01 DIAGNOSIS — R252 Cramp and spasm: Secondary | ICD-10-CM | POA: Diagnosis not present

## 2022-12-01 DIAGNOSIS — I10 Essential (primary) hypertension: Secondary | ICD-10-CM | POA: Diagnosis not present

## 2022-12-01 DIAGNOSIS — I2089 Other forms of angina pectoris: Secondary | ICD-10-CM | POA: Diagnosis not present

## 2022-12-01 DIAGNOSIS — E785 Hyperlipidemia, unspecified: Secondary | ICD-10-CM | POA: Diagnosis not present

## 2022-12-01 DIAGNOSIS — G894 Chronic pain syndrome: Secondary | ICD-10-CM | POA: Diagnosis not present

## 2022-12-01 DIAGNOSIS — N261 Atrophy of kidney (terminal): Secondary | ICD-10-CM | POA: Diagnosis not present

## 2022-12-01 DIAGNOSIS — R636 Underweight: Secondary | ICD-10-CM | POA: Diagnosis not present

## 2022-12-01 DIAGNOSIS — R809 Proteinuria, unspecified: Secondary | ICD-10-CM | POA: Diagnosis not present

## 2023-03-25 ENCOUNTER — Other Ambulatory Visit (HOSPITAL_COMMUNITY): Payer: Self-pay | Admitting: Nurse Practitioner

## 2023-03-25 DIAGNOSIS — Z13820 Encounter for screening for osteoporosis: Secondary | ICD-10-CM

## 2023-03-25 DIAGNOSIS — Z122 Encounter for screening for malignant neoplasm of respiratory organs: Secondary | ICD-10-CM

## 2023-04-05 ENCOUNTER — Ambulatory Visit (HOSPITAL_COMMUNITY)
Admission: RE | Admit: 2023-04-05 | Discharge: 2023-04-05 | Disposition: A | Discharge status: Home / Self Care | Payer: Medicare Other | Source: Ambulatory Visit | Attending: Nurse Practitioner | Admitting: Nurse Practitioner

## 2023-04-05 DIAGNOSIS — F17200 Nicotine dependence, unspecified, uncomplicated: Secondary | ICD-10-CM | POA: Diagnosis not present

## 2023-04-05 DIAGNOSIS — Z13820 Encounter for screening for osteoporosis: Secondary | ICD-10-CM | POA: Diagnosis present

## 2023-04-05 DIAGNOSIS — M810 Age-related osteoporosis without current pathological fracture: Secondary | ICD-10-CM | POA: Insufficient documentation

## 2023-04-05 DIAGNOSIS — Z780 Asymptomatic menopausal state: Secondary | ICD-10-CM | POA: Diagnosis not present

## 2023-04-15 ENCOUNTER — Ambulatory Visit (HOSPITAL_COMMUNITY): Admission: RE | Admit: 2023-04-15 | Payer: Medicare Other | Source: Ambulatory Visit

## 2023-04-15 ENCOUNTER — Encounter (HOSPITAL_COMMUNITY): Payer: Self-pay

## 2023-04-20 ENCOUNTER — Ambulatory Visit (HOSPITAL_COMMUNITY): Admission: RE | Admit: 2023-04-20 | Payer: Medicare Other | Source: Ambulatory Visit

## 2023-05-05 ENCOUNTER — Ambulatory Visit (HOSPITAL_COMMUNITY)
Admission: RE | Admit: 2023-05-05 | Discharge: 2023-05-05 | Disposition: A | Discharge status: Home / Self Care | Payer: Medicare Other | Source: Ambulatory Visit | Attending: Nurse Practitioner | Admitting: Nurse Practitioner

## 2023-05-05 DIAGNOSIS — Z122 Encounter for screening for malignant neoplasm of respiratory organs: Secondary | ICD-10-CM | POA: Insufficient documentation

## 2023-05-05 DIAGNOSIS — N200 Calculus of kidney: Secondary | ICD-10-CM | POA: Insufficient documentation

## 2023-05-05 DIAGNOSIS — I2510 Atherosclerotic heart disease of native coronary artery without angina pectoris: Secondary | ICD-10-CM | POA: Insufficient documentation

## 2023-05-05 DIAGNOSIS — I700 Atherosclerosis of aorta: Secondary | ICD-10-CM | POA: Insufficient documentation

## 2023-05-05 DIAGNOSIS — J439 Emphysema, unspecified: Secondary | ICD-10-CM | POA: Insufficient documentation

## 2023-05-05 DIAGNOSIS — R911 Solitary pulmonary nodule: Secondary | ICD-10-CM | POA: Insufficient documentation

## 2023-05-05 DIAGNOSIS — F17210 Nicotine dependence, cigarettes, uncomplicated: Secondary | ICD-10-CM | POA: Insufficient documentation

## 2023-05-06 ENCOUNTER — Ambulatory Visit: Payer: Medicare Other | Admitting: Medical

## 2023-05-06 NOTE — Progress Notes (Deleted)
  Cardiology Office Note:  .   Date:  05/06/2023  ID:  Heidi Boston. Baum, DOB Jul 27, 1960, MRN [id number] PCP: Benita Stabile, MD  [facility] Providers Cardiologist:  Nona Dell, MD { Click to update primary MD,subspecialty MD or APP then REFRESH:1}   History of Present Illness: .   Heidi Mills is a 63 y.o. female with h/o HTN, HLD, tobacco use, COPD and palpitations who is being seen for overdue follow-up.   The patient was last seen 09/2019 reported chest discomfort and myoview lexiscan was ordered.   Today,  ROS: ***  Studies Reviewed: .        *** Risk Assessment/Calculations:   {Does this patient have ATRIAL FIBRILLATION?:[phone]} No BP recorded.  {Refresh Note OR Click here to enter BP  :1}***       Physical Exam:   VS:  There were no vitals taken for this visit.   Wt Readings from Last 3 Encounters:  10/03/19 111 lb 3.2 oz (50.4 kg)  02/15/18 118 lb (53.5 kg)  05/10/17 113 lb 8 oz (51.5 kg)    GEN: Well nourished, well developed in no acute distress NECK: No JVD; No carotid bruits CARDIAC: ***RRR, no murmurs, rubs, gallops RESPIRATORY:  Clear to auscultation without rales, wheezing or rhonchi  ABDOMEN: Soft, non-tender, non-distended EXTREMITIES:  No edema; No deformity   ASSESSMENT AND PLAN: .   ***    {Are you ordering a CV Procedure (e.g. stress test, cath, DCCV, TEE, etc)?   Press F2        :[id number]}  Dispo: ***  Signed, Anushri Casalino David Stall, PA-C

## 2023-06-20 NOTE — Progress Notes (Signed)
  Cardiology Office Note:  .   Date:  07/04/2023  ID:  Heidi Mills, DOB 18-Feb-1961, MRN [id number] PCP: Heidi Bickers, MD  [facility] Providers Cardiologist:  Heidi Favre, MD    History of Present Illness: .   Heidi Mills is a 63 y.o. female  with history of HTN, HLD, COPD and palpitations (Holter monitor in 2017 showing rare PAC's and PVC's). Last seen in our office 2021 with chest pain -NST ordred but never done.   Patient had a chest CT 05/05/23 and had aortic atherosclerosis and tortuous aorta, LAD coronary calcification. Her partner has cancer and it's been tough on her. She has chronic DOE from COPD/emphysema. She gets tired when she carries in groceries-she gets short of breath and heart was racing and pain in her back relieved with laying down. Smoking 15-20 cigarettes a day. No regular exercise. She has occasional sharp chest pain into her back when she's stressed and BP goes up so she takes an extra isosorbide  10 mg and it seems to help  ROS:    Studies Reviewed: Heidi Mills         Prior CV Studies:      Risk Assessment/Calculations:             Physical Exam:   VS:  BP 118/70 (BP Location: Left Arm, Patient Position: Sitting, Cuff Size: Normal)   Pulse 83   Ht 5' 1.5" (1.562 m)   Wt 91 lb (41.3 kg)   SpO2 95%   BMI 16.92 kg/m    Wt Readings from Last 3 Encounters:  07/04/23 91 lb (41.3 kg)  10/03/19 111 lb 3.2 oz (50.4 kg)  02/15/18 118 lb (53.5 kg)    GEN: Thin, in no acute distress NECK: No JVD; No carotid bruits CARDIAC: RRR, no murmurs, rubs, gallops RESPIRATORY:  decreased breath sounds throughout without rales, wheezing or rhonchi  ABDOMEN: Soft, non-tender, non-distended EXTREMITIES:  No edema; No deformity   ASSESSMENT AND PLAN: .    Chest pain relieved with isordil . normal stess echo 2017 now with aortic atherosclerosis, coronary artery atherosclerosis on lung cancer screening CT. Will order cardiac PET to further assess.   HTN well  controlled on amlodipine  and isordil   HLD-LDL 79 on lipitor 03/2023, with CAD on CT will increase lipitor 20 mg daily repeat FLP in 3 months  COPD with chronic DOE-still smoking ~1ppd and unable to quit especially with stress at home.  Palpitations-rare PAC's PVC's 2017, occasional heart racing when she exerts herself and is short of breath.  Tobacco abuse-smoking cessation discussed     Informed Consent   Shared Decision Making/Informed Consent The risks [chest pain, shortness of breath, cardiac arrhythmias, dizziness, blood pressure fluctuations, myocardial infarction, stroke/transient ischemic attack, nausea, vomiting, allergic reaction, radiation exposure, metallic taste sensation and life-threatening complications (estimated to be 1 in 10,000)], benefits (risk stratification, diagnosing coronary artery disease, treatment guidance) and alternatives of a cardiac PET stress test were discussed in detail with Heidi Mills and she agrees to proceed.     Dispo: f/u with me after PET scan.  Signed, Heidi Flake, PA-C

## 2023-07-04 ENCOUNTER — Encounter: Payer: Self-pay | Admitting: Physician Assistant

## 2023-07-04 ENCOUNTER — Ambulatory Visit: Payer: Medicare Other | Attending: Physician Assistant | Admitting: Physician Assistant

## 2023-07-04 VITALS — BP 118/70 | HR 83 | Ht 61.5 in | Wt 91.0 lb

## 2023-07-04 DIAGNOSIS — I10 Essential (primary) hypertension: Secondary | ICD-10-CM | POA: Diagnosis not present

## 2023-07-04 DIAGNOSIS — R079 Chest pain, unspecified: Secondary | ICD-10-CM | POA: Diagnosis not present

## 2023-07-04 DIAGNOSIS — E785 Hyperlipidemia, unspecified: Secondary | ICD-10-CM | POA: Diagnosis not present

## 2023-07-04 DIAGNOSIS — J449 Chronic obstructive pulmonary disease, unspecified: Secondary | ICD-10-CM

## 2023-07-04 DIAGNOSIS — Z720 Tobacco use: Secondary | ICD-10-CM

## 2023-07-04 DIAGNOSIS — R002 Palpitations: Secondary | ICD-10-CM

## 2023-07-04 MED ORDER — ATORVASTATIN CALCIUM 20 MG PO TABS: 20.0000 mg | ORAL_TABLET | Freq: Every day | ORAL | 3 refills | Status: AC

## 2023-07-04 NOTE — Patient Instructions (Signed)
 Medication Instructions:  INCREASE Lipitor to 20 mg daily  Labwork: Fasting Lipids in 3 months  Testing/Procedures: Cardiac PET CT at [facility]   Follow-Up: With Reesa Cannon PA-C after test  Any Other Special Instructions Will Be Listed Below (If Applicable).  If you need a refill on your cardiac medications before your next appointment, please call your pharmacy.

## 2023-07-05 NOTE — Addendum Note (Signed)
 Addended by: Hilmar Moldovan A on: 07/05/2023 07:10 AM   Modules accepted: Orders

## 2023-07-14 NOTE — Addendum Note (Signed)
 Addended by: Flo Hummingbird on: 07/14/2023 07:32 AM   Modules accepted: Orders

## 2023-09-30 DIAGNOSIS — R636 Underweight: Secondary | ICD-10-CM | POA: Diagnosis not present

## 2023-09-30 DIAGNOSIS — E785 Hyperlipidemia, unspecified: Secondary | ICD-10-CM | POA: Diagnosis not present

## 2023-09-30 DIAGNOSIS — I10 Essential (primary) hypertension: Secondary | ICD-10-CM | POA: Diagnosis not present

## 2023-10-05 ENCOUNTER — Encounter (HOSPITAL_COMMUNITY): Admission: RE | Admit: 2023-10-05 | Source: Ambulatory Visit

## 2023-10-07 ENCOUNTER — Other Ambulatory Visit (HOSPITAL_COMMUNITY): Payer: Self-pay | Admitting: Nurse Practitioner

## 2023-10-07 DIAGNOSIS — Z122 Encounter for screening for malignant neoplasm of respiratory organs: Secondary | ICD-10-CM

## 2023-10-18 DIAGNOSIS — I2089 Other forms of angina pectoris: Secondary | ICD-10-CM | POA: Diagnosis not present

## 2023-10-18 DIAGNOSIS — G894 Chronic pain syndrome: Secondary | ICD-10-CM | POA: Diagnosis not present

## 2023-10-18 DIAGNOSIS — R809 Proteinuria, unspecified: Secondary | ICD-10-CM | POA: Diagnosis not present

## 2023-10-18 DIAGNOSIS — R3129 Other microscopic hematuria: Secondary | ICD-10-CM | POA: Diagnosis not present

## 2023-10-18 DIAGNOSIS — E785 Hyperlipidemia, unspecified: Secondary | ICD-10-CM | POA: Diagnosis not present

## 2023-10-18 DIAGNOSIS — I10 Essential (primary) hypertension: Secondary | ICD-10-CM | POA: Diagnosis not present

## 2023-10-18 DIAGNOSIS — J449 Chronic obstructive pulmonary disease, unspecified: Secondary | ICD-10-CM | POA: Diagnosis not present

## 2023-10-18 DIAGNOSIS — R636 Underweight: Secondary | ICD-10-CM | POA: Diagnosis not present

## 2023-10-18 DIAGNOSIS — N261 Atrophy of kidney (terminal): Secondary | ICD-10-CM | POA: Diagnosis not present

## 2023-10-18 DIAGNOSIS — R252 Cramp and spasm: Secondary | ICD-10-CM | POA: Diagnosis not present

## 2023-10-20 ENCOUNTER — Telehealth: Payer: Self-pay | Admitting: Cardiology

## 2023-10-20 ENCOUNTER — Encounter (HOSPITAL_COMMUNITY): Payer: Self-pay | Admitting: *Deleted

## 2023-10-20 NOTE — Telephone Encounter (Signed)
 Appointment history states provider cancelled PET scan, will forward to provider for more details.

## 2023-10-20 NOTE — Telephone Encounter (Signed)
 Patient would like to know why order was changed from PET CT to Myocardial Perfusion. She would also like to review instructions.

## 2023-10-21 NOTE — Telephone Encounter (Signed)
 Pt notified of M. Lenze's response. Pt voiced understanding.

## 2023-10-27 ENCOUNTER — Other Ambulatory Visit (HOSPITAL_COMMUNITY): Payer: Self-pay | Admitting: Nurse Practitioner

## 2023-10-27 DIAGNOSIS — Z122 Encounter for screening for malignant neoplasm of respiratory organs: Secondary | ICD-10-CM

## 2023-10-31 ENCOUNTER — Ambulatory Visit (HOSPITAL_COMMUNITY)
Admission: RE | Admit: 2023-10-31 | Discharge: 2023-10-31 | Disposition: A | Discharge status: Home / Self Care | Source: Ambulatory Visit | Attending: Cardiology | Admitting: Cardiology

## 2023-10-31 DIAGNOSIS — R079 Chest pain, unspecified: Secondary | ICD-10-CM | POA: Insufficient documentation

## 2023-10-31 DIAGNOSIS — Z122 Encounter for screening for malignant neoplasm of respiratory organs: Secondary | ICD-10-CM | POA: Diagnosis not present

## 2023-10-31 MED ORDER — REGADENOSON 0.4 MG/5ML IV SOLN
0.4000 mg | Freq: Once | INTRAVENOUS | Status: AC
  Administered 2023-10-31: 0.4 mg via INTRAVENOUS

## 2023-10-31 MED ORDER — TECHNETIUM TC 99M TETROFOSMIN IV KIT
10.8000 | PACK | Freq: Once | INTRAVENOUS | Status: AC | PRN
  Administered 2023-10-31: 10.8 via INTRAVENOUS

## 2023-10-31 MED ORDER — REGADENOSON 0.4 MG/5ML IV SOLN
INTRAVENOUS | Status: AC
  Filled 2023-10-31: qty 5

## 2023-10-31 MED ORDER — TECHNETIUM TC 99M TETROFOSMIN IV KIT
32.3000 | PACK | Freq: Once | INTRAVENOUS | Status: AC | PRN
  Administered 2023-10-31: 32.3 via INTRAVENOUS

## 2023-11-01 ENCOUNTER — Ambulatory Visit: Payer: Self-pay | Admitting: Physician Assistant

## 2023-11-01 LAB — MYOCARDIAL PERFUSION IMAGING
LV dias vol: 57 mL (ref 46–106)
LV sys vol: 15 mL (ref 3.8–5.2)
Nuc Stress EF: 81 %
Peak HR: 89 {beats}/min
Rest HR: 74 {beats}/min
Rest Nuclear Isotope Dose: 10.8 mCi
SDS: 0
SRS: 1
SSS: 0
ST Depression (mm): 0 mm
Stress Nuclear Isotope Dose: 32.3 mCi
TID: 1.17

## 2024-01-24 ENCOUNTER — Other Ambulatory Visit (HOSPITAL_COMMUNITY): Payer: Self-pay | Admitting: Nurse Practitioner

## 2024-01-24 DIAGNOSIS — R3129 Other microscopic hematuria: Secondary | ICD-10-CM

## 2024-02-15 ENCOUNTER — Ambulatory Visit (HOSPITAL_COMMUNITY)
Admission: RE | Admit: 2024-02-15 | Discharge: 2024-02-15 | Disposition: A | Source: Ambulatory Visit | Attending: Nurse Practitioner | Admitting: Nurse Practitioner

## 2024-02-15 DIAGNOSIS — Z122 Encounter for screening for malignant neoplasm of respiratory organs: Secondary | ICD-10-CM | POA: Insufficient documentation
